# Patient Record
Sex: Female | Born: 1940 | Race: White | Hispanic: No
Health system: Midwestern US, Community
[De-identification: ages and names within clinical notes are randomized; demographics above are authoritative.]

## PROBLEM LIST (undated history)

## (undated) DIAGNOSIS — IMO0002 Reserved for concepts with insufficient information to code with codable children: Secondary | ICD-10-CM

---

## 2014-01-11 LAB — AMB POC URINALYSIS DIP STICK AUTO W/O MICRO
Bilirubin (UA POC): NEGATIVE
Blood (UA POC): NEGATIVE
Glucose (UA POC): NEGATIVE
Ketones (UA POC): NEGATIVE
Nitrites (UA POC): NEGATIVE
Protein (UA POC): NEGATIVE mg/dL
Specific gravity (UA POC): 1.02 (ref 1.001–1.035)
Urobilinogen (UA POC): 0.2 (ref 0.2–1)
pH (UA POC): 6.5 (ref 4.6–8.0)

## 2014-01-11 NOTE — Procedures (Signed)
CYSTOSCOPY PROCEDURE        Patient Name: Michelle LeepJudith F Snyder            Date of Procedure: 01/11/2014     Pre-procedure Diagnosis:     ICD-9-CM    1. 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 Urinary incontinence 788.30 AMB POC URINALYSIS DIP STICK AUTO W/O MICRO   3. Urinary frequency 788.41 AMB POC URINALYSIS DIP STICK AUTO W/O MICRO        Post-procedure Diagnosis: same    Consent:  All risks, benefits and options were reviewed in detail and the patient agrees to procedure. Risks include but are not limited to bleeding, infection, sepsis, death, dysuria and others.     Procedure:  The patient was placed in the lithotomy position, and prepped and draped in the normal fashion. 5 ml of 4% Lidocaine gel was placed in the urethra. Once adequate anesthesia was achieved; the flexible cystoscope was placed into the bladder.     Findings as follows:      Meatus: normal  Urethra: normal  Bladder neck: normal  Trigone:  normal  Trabeculation:normal  Diverticuli:  none  Lesion:  none    Antibiotic provided:  Yes     Lab / Imaging:   Results for orders placed in visit on 01/11/14   AMB POC URINALYSIS DIP STICK AUTO W/O MICRO       Result Value Ref Range    Color (UA POC) Yellow      Clarity (UA POC) Clear      Glucose (UA POC) Negative  Negative    Bilirubin (UA POC) Negative  Negative    Ketones (UA POC) Negative  Negative    Specific gravity (UA POC) 1.020  1.001 - 1.035    Blood (UA POC) Negative  Negative    pH (UA POC) 6.5  4.6 - 8.0    Protein (UA POC) Negative  Negative mg/dL    Urobilinogen (UA POC) 0.2 mg/dL  0.2 - 1    Nitrites (UA POC) Negative  Negative    Leukocyte esterase (UA POC) 1+  Negative        Diagnoses:       ICD-9-CM    1. 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 Urinary incontinence 788.30 AMB POC URINALYSIS DIP STICK AUTO W/O MICRO   3. Urinary frequency 788.41 AMB POC URINALYSIS DIP STICK AUTO W/O MICRO          SUMMARY OF VISIT AND PLAN:    1. Bladder  pain- cysto negative.     Treasa SchoolJennifer U Miles-Thomas, MD

## 2014-01-11 NOTE — Progress Notes (Signed)
ASSESSMENT:     ICD-9-CM    1. 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 Urinary incontinence 788.30 AMB POC URINALYSIS DIP STICK AUTO W/O MICRO     CYSTOURETHROSCOPY   3. Urinary frequency 788.41 AMB POC URINALYSIS DIP STICK AUTO W/O MICRO     CYSTOURETHROSCOPY                  PLAN:      1. Cystocele- resolved  2. Bladder pain- cysto negative. Possible pasage of small stones. Will begin low acid diet and order noncontrast CT to r/o stones.            DISCUSSION:      Chief Complaint   Patient presents with   ??? Bladder Pain     Patient states that she is having some right sided bladder pain that comes and goes. Patient states that she has noticed fine grains of sand in her urine. She took samples to PCP office and has not heard anything back from them.    ??? Cystocele     Patient states that she is not to bothered but it. Occassionaly she will notice some bleeding after intercourse.        HISTORY OF PRESENT ILLNESS:  Michelle Snyder is a 73 y.o. female who presents today for reevaluation. She reports that she felt she had a UTI just prior to Christmas. UA and culture were negative. She reports the passage of small amts of sand. Reports RLQ pain which increases with caffeine. Reports it is an ache. Locates it to right suprapubic pain.  Occ vaginal spotting and using vaginal estrogen. Denies SUI.        REVIEW OF SYSTEMS:     Constitutional: Fever: No  Skin: Rash: No  HEENT: Hearing difficulty: No  Eyes: Blurred vision: No  Cardiovascular: Chest pain: No  Respiratory: Shortness of breath: No  Gastrointestinal: Nausea/vomiting: No  Musculoskeletal: Back pain: No  Neurological: Weakness: No  Psychological: Memory loss: No  Comments/additional findings:       Past Medical History   Diagnosis Date   ??? Cystocele    ??? Urinary incontinence    ??? Urinary frequency    ??? UTI (urinary tract infection)    ??? High cholesterol        Past Surgical  History   Procedure Laterality Date   ??? Hx urological  01/2013     bladder sling   ??? 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 Heart Disease Mother    ??? Heart Disease Father        History   Substance Use Topics   ??? Smoking status: Former Smoker   ??? Smokeless tobacco: Never Used   ??? Alcohol Use: Yes       No Known Allergies    Current Outpatient Prescriptions   Medication Sig Dispense Refill   ??? simvastatin (ZOCOR) 20 mg tablet Take  by mouth nightly.       ??? aspirin delayed-release 81 mg tablet Take  by mouth daily.       ??? raloxifene (EVISTA) 60 mg tablet Take  by mouth daily.       ??? montelukast (SINGULAIR) 10 mg tablet Take 10 mg by mouth daily.       ???  cholecalciferol, vitamin D3, (VITAMIN D3) 2,000 unit tab Take  by mouth.       ??? multivitamin (ONE A DAY) tablet Take 1 Tab by mouth daily.       ??? glucosamine-chondroitin 750-600 mg chew Take  by mouth.       ??? naproxen sodium (ALEVE) 220 mg tablet Take 220 mg by mouth two (2) times daily (with meals).       ??? estradiol (VAGIFEM) 10 mcg tab vaginal tablet Insert 10 mcg into vagina daily.           OB History    Gravida Para Term Preterm AB TAB SAB Ectopic Multiple Living    3 2 2  0 1 0 1 0 0 0            PHYSICAL EXAMINATION:     BP 132/90    Ht 5\' 5"  (1.651 m)    Wt 148 lb (67.132 kg)    BMI 24.63 kg/m2     Constitutional: Well developed, well-nourished female in no acute distress.   CV:  No peripheral swelling noted  Respiratory: No respiratory distress or difficulties  Abdomen:  Soft and nontender. No masses.    GU Female:     Vagina: nl, no cystocele. No mesh palpated. No erosion   Urethra: nl   Cervix and uterus absent    Rectocele:  none    Urethral hypermobility is mild   Negative leak with cough/strain.    Skin: No bruising or rashes.  No petechia.    Neuro/Psych:  Patient with appropriate affect.  Alert and oriented.    Lymphatic:   No enlargement of inguinal lymph nodes.        REVIEW OF LABS AND  IMAGING:      Results for orders placed in visit on 01/11/14   AMB POC URINALYSIS DIP STICK AUTO W/O MICRO       Result Value Ref Range    Color (UA POC) Yellow      Clarity (UA POC) Clear      Glucose (UA POC) Negative  Negative    Bilirubin (UA POC) Negative  Negative    Ketones (UA POC) Negative  Negative    Specific gravity (UA POC) 1.020  1.001 - 1.035    Blood (UA POC) Negative  Negative    pH (UA POC) 6.5  4.6 - 8.0    Protein (UA POC) Negative  Negative mg/dL    Urobilinogen (UA POC) 0.2 mg/dL  0.2 - 1    Nitrites (UA POC) Negative  Negative    Leukocyte esterase (UA POC) 1+  Negative             Prior labs or imaging:  none      A copy of today's office visit with all pertinent imaging results and labs were sent to the referring physician.        Treasa School, MD on 01/15/2014           Freda Munro, M.D.,   FPM-RS  [facility]  a division of [facility] [place]    [address]  [place], [place]   [address]  [phone] ext [phone]  [phone] fax

## 2014-01-11 NOTE — Procedures (Signed)
CYSTOSCOPY PROCEDURE        Patient Name: Michelle Snyder            Date of Procedure: 01/11/2014     Pre-procedure Diagnosis:     ICD-9-CM    1. 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 Urinary incontinence 788.30 AMB POC URINALYSIS DIP STICK AUTO W/O MICRO   3. Urinary frequency 788.41 AMB POC URINALYSIS DIP STICK AUTO W/O MICRO        Post-procedure Diagnosis: same    Consent:  All risks, benefits and options were reviewed in detail and the patient agrees to procedure. Risks include but are not limited to bleeding, infection, sepsis, death, dysuria and others.     Procedure:  The patient was placed in the lithotomy position, and prepped and draped in the normal fashion. 5 ml of 4% Lidocaine gel was placed in the urethra. Once adequate anesthesia was achieved; the flexible cystoscope was placed into the bladder.     Findings as follows:      Meatus: normal  Urethra: normal  Bladder neck: normal  Trigone:  normal  Trabeculation:normal  Diverticuli:  none  Lesion:  none    Antibiotic provided:  Yes     Lab / Imaging:   Results for orders placed in visit on 01/11/14   AMB POC URINALYSIS DIP STICK AUTO W/O MICRO       Result Value Ref Range    Color (UA POC) Yellow      Clarity (UA POC) Clear      Glucose (UA POC) Negative  Negative    Bilirubin (UA POC) Negative  Negative    Ketones (UA POC) Negative  Negative    Specific gravity (UA POC) 1.020  1.001 - 1.035    Blood (UA POC) Negative  Negative    pH (UA POC) 6.5  4.6 - 8.0    Protein (UA POC) Negative  Negative mg/dL    Urobilinogen (UA POC) 0.2 mg/dL  0.2 - 1    Nitrites (UA POC) Negative  Negative    Leukocyte esterase (UA POC) 1+  Negative        Diagnoses:       ICD-9-CM    1. 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 Urinary incontinence 788.30 AMB POC URINALYSIS DIP STICK AUTO W/O MICRO   3. Urinary frequency 788.41 AMB POC URINALYSIS DIP STICK AUTO W/O MICRO          SUMMARY OF VISIT AND PLAN:    1. Bladder  pain- cysto negative.     Eunice Winecoff U Miles-Thomas, MD

## 2014-01-13 NOTE — Telephone Encounter (Signed)
Spoke with patient and advised that CT ABD is schedule on 01/21/14 at 0800 at [facility]. Patient is aware of date and time.

## 2014-01-13 NOTE — Telephone Encounter (Signed)
Patient phoned in today to inquire about noncontrast CT to r/o stones appointment.  Advised patient she was in the office just two days ago.  Information will be forward to JMT's nurse to schedule.

## 2014-01-13 NOTE — Telephone Encounter (Signed)
Left message for patient to return call regarding scheduling CT ABD

## 2014-01-25 ENCOUNTER — Telehealth

## 2014-01-25 NOTE — Telephone Encounter (Signed)
Spoke with patient and advised that CT Urogram is normal. No stones or obstruction scene. Patient stated that sometimes she still has a dull ache of her left side that comes and goes. I advised patient that Dr. Lanier EnsignJMT would be out of the office for the next week but that I would forward this information to her and contact her back when she returns back to the office. Patient verbally understood.

## 2014-02-09 NOTE — Telephone Encounter (Signed)
Attempted to contact patient to Let her know the CT was fine. No stones or obstruction however patient did not have voicemail set up and phone just rang busy.

## 2014-02-10 NOTE — Telephone Encounter (Signed)
Left message for patient to return call regarding CT.

## 2014-02-10 NOTE — Telephone Encounter (Signed)
Patient called back.  I let her know Terri had tried to call this am to let her know CT was fine.

## 2014-12-13 NOTE — Telephone Encounter (Signed)
Returned call to patient no answer, left message for patient to return the call. Patient last seen 01/2014. Patient will need an office visit appointment.

## 2014-12-13 NOTE — Telephone Encounter (Signed)
-----   Message from Jayme Cloudatherine M Vaughan sent at 12/13/2014  1:08 PM EST -----  Patient thinks she may have uti.  Asking for appt or Rx.  Please call her at [phone]    [facility] Hospital[facility]athy

## 2014-12-20 ENCOUNTER — Encounter: Attending: Urology

## 2014-12-21 ENCOUNTER — Ambulatory Visit: Admit: 2014-12-21 | Payer: MEDICARE | Attending: Urology

## 2014-12-21 DIAGNOSIS — N3000 Acute cystitis without hematuria: Secondary | ICD-10-CM

## 2014-12-21 LAB — AMB POC URINALYSIS DIP STICK AUTO W/O MICRO
Bilirubin (UA POC): NEGATIVE
Glucose (UA POC): NEGATIVE
Nitrites (UA POC): POSITIVE
Specific gravity (UA POC): 1.02 (ref 1.001–1.035)
Urobilinogen (UA POC): 0.2 (ref 0.2–1)
pH (UA POC): 7.5 (ref 4.6–8.0)

## 2014-12-21 MED ORDER — PHENAZOPYRIDINE 200 MG TAB
200 mg | ORAL_TABLET | Freq: Three times a day (TID) | ORAL | Status: AC | PRN
Start: 2014-12-21 — End: 2014-12-24

## 2014-12-21 MED ORDER — CIPROFLOXACIN 500 MG TAB
500 mg | ORAL_TABLET | Freq: Two times a day (BID) | ORAL | Status: AC
Start: 2014-12-21 — End: 2014-12-24

## 2014-12-21 NOTE — Progress Notes (Signed)
ASSESSMENT:     ICD-10-CM ICD-9-CM    1. Acute cystitis without hematuria N30.00 595.0 phenazopyridine (PYRIDIUM) 200 mg tablet      ciprofloxacin HCl (CIPRO) 500 mg tablet      AMB POC URINALYSIS DIP STICK AUTO W/O MICRO      URINE C&S   2. Cystocele N81.10 618.01 phenazopyridine (PYRIDIUM) 200 mg tablet      ciprofloxacin HCl (CIPRO) 500 mg tablet      AMB POC URINALYSIS DIP STICK AUTO W/O MICRO      URINE C&S   3. Urinary incontinence, unspecified incontinence type R32 788.30 phenazopyridine (PYRIDIUM) 200 mg tablet      ciprofloxacin HCl (CIPRO) 500 mg tablet      AMB POC URINALYSIS DIP STICK AUTO W/O MICRO      URINE C&S              PLAN:      1. UTI   - will begin rx with cipro.  Urine will be sent for culture and she will be notified with the result.   2. Pyridium 200 mg  TID x 2d per use if she needs it.      She will f/u in 3-4 mos in [place] or sooner if needed.          DISCUSSION:      Chief Complaint   Patient presents with   ??? UTI     pt was having Dysuria , cloudy urine. pt currently does have sxs.     Pt presents today for possible UTI. SHe reports intermittent dysuria. Sxs began over the weekend. Denies hematuria or fevers. She also reports her urine is cloudy. Here today for evaluation of possible UTI.       Last OV 01/2014  Michelle Snyder is a 74 y.o. female who presents today with complaint of dysuria, frequency, urgency cloudy urine, and pelvic pressure that resolved 1 week ago; these have resolved now. In October, Dr. Ventura Sellers performed UA, provided patient Macrobid 100 mg to use post-coitally. She had it on hand while on a cruise in December; used Macrobid 12/01/14 - 12/08/13. She continued with symptoms until 01/16/14. She uses macrobid postcoitally.     01/21/14  Prior history obtained on 01/21/14: for reevaluation. She reports that she felt she had a UTI just prior to Christmas. UA and culture were negative.  She reports the passage of small amts of sand. Reports RLQ pain which increases with caffeine. Reports it is an ache. Locates it to right suprapubic pain.  Occ vaginal spotting and using vaginal estrogen. Denies SUI.        REVIEW OF SYSTEMS:     Constitutional: Fever: No  Skin: Rash: No  HEENT: Hearing difficulty: No  Eyes: Blurred vision: No  Cardiovascular: Chest pain: No  Respiratory: Shortness of breath: No  Gastrointestinal: Nausea/vomiting: No  Musculoskeletal: Back pain: No  Neurological: Weakness: No  Psychological: Memory loss: No  Comments/additional findings:       Past Medical History   Diagnosis Date   ??? Cystocele    ??? Urinary incontinence    ??? Urinary frequency    ??? UTI (urinary tract infection)    ??? High cholesterol        Past Surgical History   Procedure Laterality Date   ??? Hx urological  01/2013     bladder sling   ??? 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 Heart Disease Mother    ??? Heart Disease Father        History   Substance Use Topics   ??? Smoking status: Former Smoker   ??? Smokeless tobacco: Never Used   ??? Alcohol Use: Yes       Allergies   Allergen Reactions   ??? Sulfa (Sulfonamide Antibiotics) Other (comments)     50 yrs ago       Current Outpatient Prescriptions   Medication Sig Dispense Refill   ??? phenazopyridine (PYRIDIUM) 200 mg tablet Take 1 Tab by mouth three (3) times daily as needed for Pain for up to 3 days. 20 Tab 0   ??? ciprofloxacin HCl (CIPRO) 500 mg tablet Take 1 Tab by mouth two (2) times a day for 3 days. 6 Tab 0   ??? simvastatin (ZOCOR) 20 mg tablet Take  by mouth nightly.     ??? aspirin delayed-release 81 mg tablet Take  by mouth daily.     ??? raloxifene (EVISTA) 60 mg tablet Take  by mouth daily.     ??? montelukast (SINGULAIR) 10 mg tablet Take 10 mg by mouth daily.     ??? cholecalciferol, vitamin D3, (VITAMIN D3) 2,000 unit tab Take  by mouth.     ??? multivitamin (ONE A DAY) tablet Take 1 Tab by mouth daily.      ??? glucosamine-chondroitin 750-600 mg chew Take  by mouth.     ??? naproxen sodium (ALEVE) 220 mg tablet Take 220 mg by mouth two (2) times daily (with meals).     ??? estradiol (VAGIFEM) 10 mcg tab vaginal tablet Insert 10 mcg into vagina daily.         OB History     Gravida Para Term Preterm AB TAB SAB Ectopic Multiple Living    0 1 0 1 0 0 0            PHYSICAL EXAMINATION:     BP 140/80 mmHg   Ht  (1.651 m)   Wt 148 lb (67.132 kg)   BMI 24.63 kg/m2  Constitutional: Well developed, well-nourished female in no acute distress.   CV:  No peripheral swelling noted  Respiratory: No respiratory distress or difficulties  Abdomen:  Soft and nontender. No masses.    GU Female:  deferred  Skin: No bruising or rashes.  No petechia.    Neuro/Psych:  Patient with appropriate affect.  Alert and oriented.    Lymphatic:   No enlargement of inguinal lymph nodes.        REVIEW OF LABS AND IMAGING:      Results for orders placed or performed in visit on 12/21/14   AMB POC URINALYSIS DIP STICK AUTO W/O MICRO   Result Value Ref Range    Color (UA POC) Yellow     Clarity (UA POC) Clear     Glucose (UA POC) Negative Negative    Bilirubin (UA POC) Negative Negative    Ketones (UA POC) Trace Negative    Specific gravity (UA POC) 1.020 1.001 - 1.035    Blood (UA POC) Trace Negative    pH (UA POC) 7.5 4.6 - 8.0    Protein (UA POC) 1+ Negative mg/dL    Urobilinogen (UA POC) 0.2 mg/dL 0.2 - 1    Nitrites (UA POC) Positive Negative    Leukocyte esterase (UA POC) 4+ Negative  Prior labs or imaging:  none      A copy of today's office visit with all pertinent imaging results and labs were sent to the referring physician.        Pollyann SavoyElizabeth W Purrington, NP on 12/21/2014           Freda MunroJennifer Miles-Thomas, M.D.,   FPM-RS  [facility]  a division [facility] [place]    [address]  [place], [place]   [id number]  [phone] ext [phone]  [phone] fax

## 2014-12-23 LAB — URINE C&S

## 2014-12-27 MED ORDER — AMOXICILLIN-CLAVULANATE 500 MG-125 MG TAB
500-125 mg | ORAL_TABLET | Freq: Two times a day (BID) | ORAL | Status: DC
Start: 2014-12-27 — End: 2015-02-04

## 2014-12-27 NOTE — Progress Notes (Signed)
Quick Note:        Let her know the urine culture was positive and the cipro may not treat the infection. Sent augmentin into the pharmacy and I LM on pts cell VM.        JMT    ______

## 2014-12-28 NOTE — Telephone Encounter (Signed)
Patient called and lab results were given.  Patient did receive message about RX sent to her pharmacy.

## 2014-12-28 NOTE — Progress Notes (Signed)
Quick Note:        Patient called and lab results were given.        ______

## 2014-12-28 NOTE — Telephone Encounter (Signed)
-----   Message from Treasa SchoolJennifer U Miles-Thomas, MD sent at 12/27/2014  4:55 PM EST -----  Let her know the urine culture was positive and the cipro may not treat the infection. Sent augmentin into the pharmacy and I LM on pts cell VM.    JMT

## 2015-02-04 ENCOUNTER — Ambulatory Visit: Admit: 2015-02-04 | Discharge: 2015-02-04 | Payer: MEDICARE | Attending: Urology

## 2015-02-04 DIAGNOSIS — IMO0002 Reserved for concepts with insufficient information to code with codable children: Secondary | ICD-10-CM

## 2015-02-04 LAB — AMB POC URINALYSIS DIP STICK AUTO W/O MICRO
Bilirubin (UA POC): NEGATIVE
Blood (UA POC): NEGATIVE
Glucose (UA POC): NEGATIVE
Ketones (UA POC): NEGATIVE
Leukocyte esterase (UA POC): NEGATIVE
Nitrites (UA POC): NEGATIVE
Protein (UA POC): NEGATIVE mg/dL
Specific gravity (UA POC): 1.303 — AB (ref 1.001–1.035)
Urobilinogen (UA POC): 0.2 (ref 0.2–1)
pH (UA POC): 5.5 (ref 4.6–8.0)

## 2015-02-04 LAB — AMB POC PVR, MEAS,POST-VOID RES,US,NON-IMAGING: PVR: 0 cc

## 2015-02-04 MED ORDER — AMOXICILLIN-CLAVULANATE 500 MG-125 MG TAB
500-125 mg | ORAL_TABLET | Freq: Two times a day (BID) | ORAL | Status: DC
Start: 2015-02-04 — End: 2015-12-30

## 2015-02-04 MED ORDER — AMOXICILLIN-CLAVULANATE 500 MG-125 MG TAB
500-125 mg | ORAL_TABLET | Freq: Two times a day (BID) | ORAL | Status: DC
Start: 2015-02-04 — End: 2015-02-04

## 2015-02-04 NOTE — Progress Notes (Signed)
ASSESSMENT:     ICD-10-CM ICD-9-CM    1. Cystocele, unspecified cystocele location N81.10 618.01 AMB POC URINALYSIS DIP STICK AUTO W/O MICRO      AMB POC PVR, MEAS,POST-VOID RES,US,NON-IMAGING   2. Urinary incontinence, unspecified type R32 788.30 AMB POC URINALYSIS DIP STICK AUTO W/O MICRO      AMB POC PVR, MEAS,POST-VOID RES,US,NON-IMAGING   3. Urinary frequency R35.0 788.41 AMB POC URINALYSIS DIP STICK AUTO W/O MICRO      AMB POC PVR, MEAS,POST-VOID RES,US,NON-IMAGING   4. Recurrent UTI N39.0 599.0 AMB POC URINALYSIS DIP STICK AUTO W/O MICRO      AMB POC PVR, MEAS,POST-VOID RES,US,NON-IMAGING      amoxicillin-clavulanate (AUGMENTIN) 500-125 mg per tablet              PLAN:      1. UTI   - will begin rx with cipro.  Urine will be sent for culture and she will be notified with the result.   2. Rx for Augmentin 500/125 given for travel.    She will f/u in 2mos  or sooner if needed.          DISCUSSION:      Chief Complaint   Patient presents with   ??? Recurrent UTI     Pt presents today for possible UTI. She denied dysuria however experienced some burning in abdomen right after intercourse. Burning sensation subsided after about an hour. She noticed she that anything acidic or inflammatory would aggravate the area. She is going on a cruise and would like a prophylaxis .     Last visit 12/28/2014:   SHe reports intermittent dysuria.Sxs began over the weekend. Denies hematuria or fevers. She also reports her urine is cloudy. Here today for evaluation of possible UTI.       Last OV 01/2014  Michelle Snyder is a 74 y.o. female who presents today with complaint of dysuria, frequency, urgency cloudy urine, and pelvic pressure that resolved 1 week ago; these have resolved now. In October, Dr. [facility] performed UA, provided patient Macrobid 100 mg to use post-coitally. She had it on hand while on a cruise in December; used Macrobid 12/01/14 - 12/08/13. She continued with symptoms until 01/16/14. She  uses macrobid postcoitally.     01/21/14  Prior history obtained on 01/21/14: for reevaluation. She reports that she felt she had a UTI just prior to Christmas. UA and culture were negative. She reports the passage of small amts of sand. Reports RLQ pain which increases with caffeine. Reports it is an ache. Locates it to right suprapubic pain.  Occ vaginal spotting and using vaginal estrogen. Denies SUI.        REVIEW OF SYSTEMS:     Constitutional: Fever: No  Skin: Rash: No  HEENT: Hearing difficulty: No  Eyes: Blurred vision: No  Cardiovascular: Chest pain: No  Respiratory: Shortness of breath: No  Gastrointestinal: Nausea/vomiting: No  Musculoskeletal: Back pain: No  Neurological: Weakness:   Psychological: Memory loss: No  Comments/additional findings:       Past Medical History   Diagnosis Date   ??? Cystocele    ??? Urinary incontinence    ??? Urinary frequency    ??? UTI (urinary tract infection)    ??? High cholesterol        Past Surgical History   Procedure Laterality Date   ??? Hx urological  01/2013     bladder sling   ??? Hx knee replacement  2/20110  partial        Family History   Problem Relation Age of Onset   ??? Heart Disease Mother    ??? Heart Disease Father        History   Substance Use Topics   ??? Smoking status: Former Smoker   ??? Smokeless tobacco: Never Used   ??? Alcohol Use: Yes       Allergies   Allergen Reactions   ??? Alendronate Other (comments)   ??? Sulfa (Sulfonamide Antibiotics) Other (comments)     50 yrs ago       Current Outpatient Prescriptions   Medication Sig Dispense Refill   ??? amoxicillin-clavulanate (AUGMENTIN) 500-125 mg per tablet Take 1 Tab by mouth two (2) times a day. 10 Tab 0   ??? simvastatin (ZOCOR) 20 mg tablet Take  by mouth nightly.     ??? aspirin delayed-release 81 mg tablet Take  by mouth daily.     ??? raloxifene (EVISTA) 60 mg tablet Take  by mouth daily.     ??? montelukast (SINGULAIR) 10 mg tablet Take 10 mg by mouth daily.      ??? cholecalciferol, vitamin D3, (VITAMIN D3) 2,000 unit tab Take  by mouth.     ??? multivitamin (ONE A DAY) tablet Take 1 Tab by mouth daily.     ??? glucosamine-chondroitin 750-600 mg chew Take  by mouth.     ??? naproxen sodium (ALEVE) 220 mg tablet Take 220 mg by mouth two (2) times daily (with meals).     ??? estradiol (VAGIFEM) 10 mcg tab vaginal tablet Insert 10 mcg into vagina daily.         OB History     Gravida Para Term Preterm AB TAB SAB Ectopic Multiple Living    0 1 0 1 0 0 0            PHYSICAL EXAMINATION:     Ht  (1.651 m)   Wt 148 lb (67.132 kg)   BMI 24.63 kg/m2  Constitutional: Well developed, well-nourished female in no acute distress.   CV:  No peripheral swelling noted  Respiratory: No respiratory distress or difficulties  Abdomen:  Soft and nontender. No masses.    GU Female:  deferred  Skin: No bruising or rashes.  No petechia.    Neuro/Psych:  Patient with appropriate affect.  Alert and oriented.    Lymphatic:   No enlargement of inguinal lymph nodes.        REVIEW OF LABS AND IMAGING:      Results for orders placed or performed in visit on 12/21/14   URINE C&S   Result Value Ref Range    FINAL REPORT Microbiology results (A)    AMB POC URINALYSIS DIP STICK AUTO W/O MICRO   Result Value Ref Range    Color (UA POC) Yellow     Clarity (UA POC) Clear     Glucose (UA POC) Negative Negative    Bilirubin (UA POC) Negative Negative    Ketones (UA POC) Trace Negative    Specific gravity (UA POC) 1.020 1.001 - 1.035    Blood (UA POC) Trace Negative    pH (UA POC) 7.5 4.6 - 8.0    Protein (UA POC) 1+ Negative mg/dL    Urobilinogen (UA POC) 0.2 mg/dL 0.2 - 1    Nitrites (UA POC) Positive Negative    Leukocyte esterase (UA POC) 4+ Negative  Prior labs or imaging:  none      A copy of today's office visit with all pertinent imaging results and labs were sent to the referring physician.        Treasa School, MD on 02/04/2015           Freda Munro, M.D.,   FPM-RS   [facility]  a division of [facility] [place]    [address]  [place], [place]   [address]  [phone] ext [phone]  [phone] fax

## 2015-04-22 ENCOUNTER — Ambulatory Visit: Admit: 2015-04-22 | Discharge: 2015-04-22 | Payer: MEDICARE | Attending: Urology

## 2015-04-22 DIAGNOSIS — IMO0002 Reserved for concepts with insufficient information to code with codable children: Secondary | ICD-10-CM

## 2015-04-22 LAB — AMB POC URINALYSIS DIP STICK AUTO W/O MICRO
Bilirubin (UA POC): NEGATIVE
Blood (UA POC): NEGATIVE
Glucose (UA POC): NEGATIVE
Ketones (UA POC): NEGATIVE
Nitrites (UA POC): NEGATIVE
Protein (UA POC): NEGATIVE mg/dL
Specific gravity (UA POC): 1.025 (ref 1.001–1.035)
Urobilinogen (UA POC): 0.2 (ref 0.2–1)
pH (UA POC): 6.5 (ref 4.6–8.0)

## 2015-04-22 LAB — AMB POC PVR, MEAS,POST-VOID RES,US,NON-IMAGING: PVR: 8 cc

## 2015-04-22 MED ORDER — NITROFURANTOIN (25% MACROCRYSTAL FORM) 100 MG CAP
100 mg | ORAL_CAPSULE | Freq: Every evening | ORAL | Status: DC | PRN
Start: 2015-04-22 — End: 2015-12-30

## 2015-04-22 NOTE — Progress Notes (Signed)
ASSESSMENT:     ICD-10-CM ICD-9-CM    1. Cystocele, unspecified cystocele location N81.10 618.01 AMB POC PVR, MEAS,POST-VOID RES,US,NON-IMAGING      AMB POC URINALYSIS DIP STICK AUTO W/O MICRO        PLAN:      1. UTI   - UA today: normal. no current symptoms of an infection. Given pericoital antibiotic: Macrobid 100 mg- QHS PRN     F/u in 6 months       DISCUSSION:      Chief Complaint   Patient presents with   ??? Recurrent UTI     Patient is in for a follow up today. Patient denies any sxs of UTI.       History of Present Illness:  Michelle Snyder is a 74 y.o. female who presents today for follow up and management of recurrent infections.   Patient states that she recently finished Cipro and has not experiencing and symptoms associated with an infection. No current symptoms. Denies any dysuria, hematuria, or voiding symptoms.   No f/n/c/v.       Last OV 01/2014  Michelle Snyder is a 74 y.o. female who presents today with complaint of dysuria, frequency, urgency cloudy urine, and pelvic pressure that resolved 1 week ago; these have resolved now. In October, Dr. [facility] performed UA, provided patient Macrobid 100 mg to use post-coitally. She had it on hand while on a cruise in December; used Macrobid 12/01/14 - 12/08/13. She continued with symptoms until 01/16/14. She uses macrobid postcoitally.     01/21/14  Prior history obtained on 01/21/14: for reevaluation. She reports that she felt she had a UTI just prior to Christmas. UA and culture were negative. She reports the passage of small amts of sand. Reports RLQ pain which increases with caffeine. Reports it is an ache. Locates it to right suprapubic pain.  Occ vaginal spotting and using vaginal estrogen. Denies SUI.        REVIEW OF SYSTEMS:     Constitutional: Fever: No  Skin: Rash: No  HEENT: Hearing difficulty: No  Eyes: Blurred vision: No  Cardiovascular: Chest pain: No  Respiratory: Shortness of breath: No   Gastrointestinal: Nausea/vomiting: No  Musculoskeletal: Back pain: No  Neurological: Weakness: No  Psychological: Memory loss: No  Comments/additional findings:       Past Medical History   Diagnosis Date   ??? Cystocele    ??? Urinary incontinence    ??? Urinary frequency    ??? UTI (urinary tract infection)    ??? High cholesterol    ??? Recurrent UTI        Past Surgical History   Procedure Laterality Date   ??? Hx urological  01/2013     bladder sling   ??? 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 Heart Disease Mother    ??? Heart Disease Father        History   Substance Use Topics   ??? Smoking status: Former Smoker   ??? Smokeless tobacco: Never Used   ??? Alcohol Use: Yes       Allergies   Allergen Reactions   ??? Alendronate Other (comments)   ??? Sulfa (Sulfonamide Antibiotics) Other (comments)     50 yrs ago       Current Outpatient Prescriptions   Medication Sig Dispense Refill   ??? levothyroxine (  SYNTHROID) 75 mcg tablet      ??? simvastatin (ZOCOR) 20 mg tablet Take  by mouth nightly.     ??? aspirin delayed-release 81 mg tablet Take  by mouth daily.     ??? raloxifene (EVISTA) 60 mg tablet Take  by mouth daily.     ??? cholecalciferol, vitamin D3, (VITAMIN D3) 2,000 unit tab Take  by mouth.     ??? multivitamin (ONE A DAY) tablet Take 1 Tab by mouth daily.     ??? glucosamine-chondroitin 750-600 mg chew Take  by mouth.     ??? naproxen sodium (ALEVE) 220 mg tablet Take 220 mg by mouth two (2) times daily (with meals).     ??? estradiol (VAGIFEM) 10 mcg tab vaginal tablet Insert 10 mcg into vagina daily.     ??? amoxicillin-clavulanate (AUGMENTIN) 500-125 mg per tablet Take 1 Tab by mouth two (2) times a day. 10 Tab 0       OB History     Gravida Para Term Preterm AB TAB SAB Ectopic Multiple Living    0 1 0 1 0 0 0            PHYSICAL EXAMINATION:     BP 110/60 mmHg   Ht  (1.651 m)   Wt 148 lb (67.132 kg)   BMI 24.63 kg/m2   Constitutional: Well developed, well-nourished female in no acute distress.   CV:  No peripheral swelling noted  Respiratory: No respiratory distress or difficulties  Abdomen:  Soft and nontender. No masses.    GU Female:  deferred  Skin: No bruising or rashes.  No petechia.    Neuro/Psych:  Patient with appropriate affect.  Alert and oriented.    Lymphatic:   No enlargement of inguinal lymph nodes.        REVIEW OF LABS AND IMAGING:      Results for orders placed or performed in visit on 04/22/15   AMB POC PVR, MEAS,POST-VOID RES,US,NON-IMAGING   Result Value Ref Range    PVR 8 cc   AMB POC URINALYSIS DIP STICK AUTO W/O MICRO   Result Value Ref Range    Color (UA POC) Yellow     Clarity (UA POC) Clear     Glucose (UA POC) Negative Negative    Bilirubin (UA POC) Negative Negative    Ketones (UA POC) Negative Negative    Specific gravity (UA POC) 1.025 1.001 - 1.035    Blood (UA POC) Negative Negative    pH (UA POC) 6.5 4.6 - 8.0    Protein (UA POC) Negative Negative mg/dL    Urobilinogen (UA POC) 0.2 mg/dL 0.2 - 1    Nitrites (UA POC) Negative Negative    Leukocyte esterase (UA POC) Trace Negative             Prior labs or imaging:  none      A copy of today's office visit with all pertinent imaging results and labs were sent to the referring physician.        Treasa School, MD on 04/22/2015           Freda Munro, M.D.,   FPM-RS  [facility]  a division of [facility] [place]    [address]  [place], [place]   [address]  [phone] ext [phone]  [phone] fax        Medical documentation provided with the assistance of Britt Bottom,  medical scribe for Treasa School, MD.

## 2015-10-21 ENCOUNTER — Encounter: Attending: Urology

## 2015-12-23 LAB — VITAMIN D, 25 HYDROXY: Vit D 25-Hydroxy: 48.5 ng/mL (ref 32.0–100.0)

## 2015-12-30 ENCOUNTER — Ambulatory Visit: Admit: 2015-12-30 | Discharge: 2015-12-30 | Payer: MEDICARE | Attending: Urology

## 2015-12-30 ENCOUNTER — Encounter: Attending: Urology

## 2015-12-30 DIAGNOSIS — N390 Urinary tract infection, site not specified: Secondary | ICD-10-CM

## 2015-12-30 LAB — AMB POC URINALYSIS DIP STICK AUTO W/O MICRO
Bilirubin (UA POC): NEGATIVE
Glucose (UA POC): NEGATIVE
Ketones (UA POC): NEGATIVE
Nitrites (UA POC): NEGATIVE
Protein (UA POC): NEGATIVE mg/dL
Specific gravity (UA POC): 1.01 (ref 1.001–1.035)
Urobilinogen (UA POC): 0.2 (ref 0.2–1)
pH (UA POC): 7 (ref 4.6–8.0)

## 2015-12-30 MED ORDER — NITROFURANTOIN (25% MACROCRYSTAL FORM) 100 MG CAP
100 mg | ORAL_CAPSULE | Freq: Every evening | ORAL | 3 refills | Status: DC | PRN
Start: 2015-12-30 — End: 2018-04-25

## 2015-12-30 NOTE — Progress Notes (Signed)
ASSESSMENT:     ICD-10-CM ICD-9-CM    1. Recurrent UTI N39.0 599.0 AMB POC URINALYSIS DIP STICK AUTO W/O MICRO      nitrofurantoin, macrocrystal-monohydrate, (MACROBID) 100 mg capsule        PLAN:      1. Recurrent UTI   - UA clear today. No recent infections. Continue Macrobid 100 mg post coitally. Refill given today.     Follow up in 1 year       DISCUSSION:      Chief Complaint   Patient presents with   ??? Recurrent UTI     Pt presents for  9 month f/u. Pt states she has not had any infections thanks to the MACROBID 100 MG. Denies any and all urinary infection symptoms at this time.        History of Present Illness:  Michelle Snyder is a 75 y.o. female who presents today in follow up for recurrent UTI.   She denies any infection since we last met in May 2016. She continues on Macrobid 100 mg post coitus.  She had a negative hematuria work up in 2015.  No issues at this time. She denies any urinary frequency, urgency, incomplete bladder emptying.  She is asymptomatic for UTI today. No dysuria, gross hematuria. No f/c/n/v.       PRIOR VISIT:  5//20/16: She presents today for follow up and management of recurrent infections.   Patient states that she recently finished Cipro and has not experiencing and symptoms associated with an infection. No current symptoms. Denies any dysuria, hematuria, or voiding symptoms.   No f/n/c/v.     01/2014 who presents today with complaint of dysuria, frequency, urgency cloudy urine, and pelvic pressure that resolved 1 week ago; these have resolved now. In October, Dr. Georgann Housekeeper performed UA, provided patient Macrobid 100 mg to use post-coitally. She had it on hand while on a cruise in December; used Macrobid 12/01/14 - 12/08/13. She continued with symptoms until 01/16/14. She uses macrobid postcoitally.     01/21/14 Prior history obtained on 01/21/14: for reevaluation. She reports that she felt she had a UTI just prior to Christmas. UA and culture were  negative. She reports the passage of small amts of sand. Reports RLQ pain which increases with caffeine. Reports it is an ache. Locates it to right suprapubic pain.  Occ vaginal spotting and using vaginal estrogen. Denies SUI.        REVIEW OF SYSTEMS:     Constitutional: Fever: No  Skin: Rash: No  HEENT: Hearing difficulty: No  Eyes: Blurred vision: No  Cardiovascular: Chest pain: No  Respiratory: Shortness of breath: No  Gastrointestinal: Nausea/vomiting: No  Musculoskeletal: Back pain: No  Neurological: Weakness: No  Psychological: Memory loss: No  Comments/additional findings:       Past Medical History   Diagnosis Date   ??? Cystocele    ??? Cystocele, unspecified    ??? High cholesterol    ??? Recurrent UTI    ??? Urinary frequency    ??? Urinary incontinence    ??? UTI (urinary tract infection)        Past Surgical History   Procedure Laterality Date   ??? Hx urological  01/2013     bladder sling   ??? Hx knee replacement  2/20110     partial    ??? Hx other surgical  2005     PLASTIC SURGERY   ??? Hx other surgical  1971  FINE NEEDLE ASPIRATE, CYTOLOGY   ??? Hx polypectomy  2007   ??? Hx other surgical  12/20/2008     CYSTOCELE REPAIR SLING POST PROCEDURE ORDERSET 72       Family History   Problem Relation Age of Onset   ??? Heart Disease Mother    ??? Heart Disease Father        Social History   Substance Use Topics   ??? Smoking status: Former Smoker   ??? Smokeless tobacco: Never Used   ??? Alcohol use Yes       Allergies   Allergen Reactions   ??? Alendronate Other (comments)   ??? Sulfa (Sulfonamide Antibiotics) Other (comments)     50 yrs ago       Current Outpatient Prescriptions   Medication Sig Dispense Refill   ??? GLUCOSAMINE HCL (GLUCOSAMINE, BULK,) powd 1 Tab by Misc.(Non-Drug; Combo Route) route Once a Day.      ??? cetirizine (ZYRTEC) 10 mg tablet 10 mg.     ??? levothyroxine (SYNTHROID) 175 mcg tablet 0.075 mg.     ??? atorvastatin (LIPITOR) 10 mg tablet      ??? nitrofurantoin, macrocrystal-monohydrate, (MACROBID) 100 mg capsule Take  1 Cap by mouth nightly as needed. 30 Cap 3   ??? simvastatin (ZOCOR) 20 mg tablet Take  by mouth nightly.     ??? aspirin delayed-release 81 mg tablet Take  by mouth daily.     ??? raloxifene (EVISTA) 60 mg tablet Take  by mouth daily.     ??? cholecalciferol, vitamin D3, (VITAMIN D3) 2,000 unit tab Take  by mouth.     ??? multivitamin (ONE A DAY) tablet Take 1 Tab by mouth daily.     ??? glucosamine-chondroitin 750-600 mg chew Take  by mouth.     ??? naproxen sodium (ALEVE) 220 mg tablet Take 220 mg by mouth two (2) times daily (with meals).     ??? estradiol (VAGIFEM) 10 mcg tab vaginal tablet Insert 10 mcg into vagina daily.         OB History     Gravida Para Term Preterm AB TAB SAB Ectopic Multiple Living    '3 2 2 ' 0 1 0 1 0 0 0            PHYSICAL EXAMINATION:     Visit Vitals   ??? BP 106/82   ??? Ht '5\' 5"'  (1.651 m)   ??? Wt 148 lb (67.1 kg)   ??? BMI 24.63 kg/m2     Constitutional: Well developed, well-nourished female in no acute distress.   CV:  No peripheral swelling noted  Respiratory: No respiratory distress or difficulties  Abdomen:  Soft and nontender. No masses.    GU Female:  deferred  Skin: No bruising or rashes.  No petechia.    Neuro/Psych:  Patient with appropriate affect.  Alert and oriented.    Lymphatic:   No enlargement of inguinal lymph nodes.        REVIEW OF LABS AND IMAGING:      Results for orders placed or performed in visit on 12/30/15   AMB POC URINALYSIS DIP STICK AUTO W/O MICRO   Result Value Ref Range    Color (UA POC) Yellow     Clarity (UA POC) Clear     Glucose (UA POC) Negative Negative    Bilirubin (UA POC) Negative Negative    Ketones (UA POC) Negative Negative    Specific gravity (UA POC) 1.010 1.001 - 1.035  Blood (UA POC) 2+ Negative    pH (UA POC) 7.0 4.6 - 8.0    Protein (UA POC) Negative Negative mg/dL    Urobilinogen (UA POC) 0.2 mg/dL 0.2 - 1    Nitrites (UA POC) Negative Negative    Leukocyte esterase (UA POC) 2+ Negative             Prior labs or imaging:  none       A copy of today's office visit with all pertinent imaging results and labs were sent to the referring physician.        Brantley Fling, MD on 01/16/2016           Michaell Cowing, M.D.,   [facility]  a division of [facility] [place]    [place], [place]  [phone] ext [phone]  [phone] fax      Documentation assisted by Heron Nay, medical scribe for Brantley Fling, MD.

## 2016-02-28 NOTE — Telephone Encounter (Signed)
Patient called to ask Dr Wyvonnia DuskyMiles-Thomas is she would send in a prescription for her to take on vacation in case she was to get a uti while gone.  She said we have done this in the past for her.  She currently takes macrobid after intercourse.  She is leaving April 21 and return 5/11.  I told her I would ask and we will call her back.

## 2016-02-29 MED ORDER — AMOXICILLIN CLAVULANATE 875 MG-125 MG TAB
875-125 mg | ORAL_TABLET | Freq: Two times a day (BID) | ORAL | 0 refills | Status: DC
Start: 2016-02-29 — End: 2017-09-03

## 2016-02-29 NOTE — Telephone Encounter (Signed)
Called Michelle Snyder back to let her know [facility] would not prescribe Cipro for the patient to take on vacation, because the last culture was resistant to Cipro  But senssitive to Augmentin.  The patient still wanted to argue that the Cipro didn't clear up an infection last year.  I again explained why the doctor was prescribing Augmentin instead of Cipro.  The patient's reply was "oh well".

## 2016-02-29 NOTE — Telephone Encounter (Signed)
Per Dr Wyvonnia DuskyMiles-Thomas ok to send in Augmentin 875 mg bid x 5 days for trip

## 2016-12-28 ENCOUNTER — Encounter: Attending: Urology

## 2017-01-07 ENCOUNTER — Ambulatory Visit: Admit: 2017-01-07 | Discharge: 2017-01-07 | Payer: MEDICARE | Attending: Urology

## 2017-01-07 DIAGNOSIS — R32 Unspecified urinary incontinence: Secondary | ICD-10-CM

## 2017-01-07 LAB — AMB POC URINALYSIS DIP STICK AUTO W/O MICRO
Bilirubin (UA POC): NEGATIVE
Glucose (UA POC): NEGATIVE
Ketones (UA POC): NEGATIVE
Leukocyte esterase (UA POC): NEGATIVE
Nitrites (UA POC): NEGATIVE
Protein (UA POC): NEGATIVE
Specific gravity (UA POC): 1.015 (ref 1.001–1.035)
Urobilinogen (UA POC): 0.2 (ref 0.2–1)
pH (UA POC): 7 (ref 4.6–8.0)

## 2017-01-07 MED ORDER — AMOXICILLIN CLAVULANATE 875 MG-125 MG TAB
875-125 mg | ORAL_TABLET | ORAL | 2 refills | Status: DC
Start: 2017-01-07 — End: 2017-09-03

## 2017-01-07 MED ORDER — NITROFURANTOIN (25% MACROCRYSTAL FORM) 100 MG CAP
100 mg | ORAL_CAPSULE | Freq: Every evening | ORAL | 3 refills | Status: DC
Start: 2017-01-07 — End: 2017-09-09

## 2017-01-07 NOTE — Progress Notes (Signed)
ASSESSMENT:     ICD-10-CM ICD-9-CM    1. Urinary incontinence, unspecified type R32 788.30 AMB POC URINALYSIS DIP STICK AUTO W/O MICRO   2. Urinary frequency R35.0 788.41 AMB POC URINALYSIS DIP STICK AUTO W/O MICRO   3. Recurrent UTI N39.0 599.0       PLAN:      1. Recurrent UTI  - UA today trace blood. Stable, no recent infections and asymptomatic. Patient is aware the she can schedule a same day UTI appointment within the RECON pod if she feels the onset of infectious symptoms. Aware of cath specimen.    - Discussed use of Augmentin PO BID x5 days in the presence of UTI associated symptoms or when travelling - rx provided.    - Continue post coital Macrobid - rx provided.     2. Body mass index is 24.63 kg/(m^2). BMI is within the normal parameters.     RTC 1 year; sooner if needed.   All questions answered     DISCUSSION:      Chief Complaint   Patient presents with   ??? Recurrent UTI      63yrf/u pt has no sxs at this time.        History of Present Illness:  Michelle STIEFELis a 76y.o. female who presents today in follow up for recurrent UTI. Patient has done well over the interim, no recent infections and currently asymptomatic. Taking Macrobid post coitally without any issues. Coffee and carbonated drinks serve as a trigger for dysuria. Has minor episode of right 'pinching' sensation, ?UTI however symptoms resolved shortly after a BM. States she is due to have a prolia injection today and believes this may be trigger for her infections. ?Microhematuria presence being a precursor for UTI's.  Denies flank pain, gross hematuria and dysuria. No f/c/n/v.     Has previously been treated with Cipro, Augmentin, Nitrofurantoin, Macrobid - admits UTI associated symptoms did not resolved with course of Cipro.     PRIOR VISIT:  12/30/15: She denies any infection since we last met in May 2016. She continues on Macrobid 100 mg post coitus.  She had a negative hematuria  work up in 2015.  No issues at this time. She denies any urinary frequency, urgency, incomplete bladder emptying.  She is asymptomatic for UTI today. No dysuria, gross hematuria. No f/c/n/v.     5//20/16: She presents today for follow up and management of recurrent infections.   Patient states that she recently finished Cipro and has not experiencing and symptoms associated with an infection. No current symptoms. Denies any dysuria, hematuria, or voiding symptoms.   No f/n/c/v.     01/2014 who presents today with complaint of dysuria, frequency, urgency cloudy urine, and pelvic pressure that resolved 1 week ago; these have resolved now. In October, Dr. SGeorgann Housekeeperperformed UA, provided patient Macrobid 100 mg to use post-coitally. She had it on hand while on a cruise in December; used Macrobid 12/01/14 - 12/08/13. She continued with symptoms until 01/16/14. She uses macrobid postcoitally.     01/21/14 Prior history obtained on 01/21/14: for reevaluation. She reports that she felt she had a UTI just prior to Christmas. UA and culture were negative. She reports the passage of small amts of sand. Reports RLQ pain which increases with caffeine. Reports it is an ache. Locates it to right suprapubic pain.  Occ vaginal spotting and using vaginal estrogen. Denies SUI.        REVIEW  OF SYSTEMS:     Constitutional: Fever: No  Skin: Rash: No  HEENT: Hearing difficulty: No  Eyes: Blurred vision: No  Cardiovascular: Chest pain: No  Respiratory: Shortness of breath: No  Gastrointestinal: Nausea/vomiting: No  Musculoskeletal: Back pain: No  Neurological: Weakness: No  Psychological: Memory loss: No  Comments/additional findings:       Past Medical History:   Diagnosis Date   ??? Cystocele    ??? Cystocele, unspecified    ??? High cholesterol    ??? Recurrent UTI    ??? Urinary frequency    ??? Urinary incontinence    ??? UTI (urinary tract infection)        Past Surgical History:   Procedure Laterality Date   ??? HX KNEE REPLACEMENT  2/20110     partial    ??? HX OTHER SURGICAL  2005    PLASTIC SURGERY   ??? HX OTHER SURGICAL  1971    FINE NEEDLE ASPIRATE, CYTOLOGY   ??? HX OTHER SURGICAL  12/20/2008    CYSTOCELE REPAIR SLING POST PROCEDURE ORDERSET 517   ??? HX POLYPECTOMY  2007   ??? HX UROLOGICAL  01/2013    bladder sling       Family History   Problem Relation Age of Onset   ??? Heart Disease Mother    ??? Heart Disease Father        Social History   Substance Use Topics   ??? Smoking status: Former Smoker   ??? Smokeless tobacco: Never Used   ??? Alcohol use Yes       Allergies   Allergen Reactions   ??? Alendronate Other (comments)   ??? Sulfa (Sulfonamide Antibiotics) Other (comments)     50 yrs ago   ??? Sulfasalazine Other (comments)     Other reaction(s): Dermatological problems, e.g., rash, hives       Current Outpatient Prescriptions   Medication Sig Dispense Refill   ??? conjugated estrogens (PREMARIN) 0.625 mg tablet Take  by mouth.     ??? fluticasone (FLONASE) 50 mcg/actuation nasal spray 2 Sprays by Both Nostrils route daily.     ??? Azelastine (ASTEPRO) 0.15 % (205.5 mcg) nasal spray two (2) times a day.     ??? denosumab (PROLIA) 60 mg/mL injection 60 mg by SubCUTAneous route.     ??? nitrofurantoin, macrocrystal-monohydrate, (MACROBID) 100 mg capsule Take 1 Cap by mouth nightly. 30 Cap 3   ??? amoxicillin-clavulanate (AUGMENTIN) 875-125 mg per tablet Take 1 tablet PO BID x5 days in the presence of UTI associated symptoms or when travelling. 10 Tab 2   ??? levothyroxine (SYNTHROID) 175 mcg tablet 0.075 mg.     ??? atorvastatin (LIPITOR) 10 mg tablet      ??? nitrofurantoin, macrocrystal-monohydrate, (MACROBID) 100 mg capsule Take 1 Cap by mouth nightly as needed. 30 Cap 3   ??? simvastatin (ZOCOR) 20 mg tablet Take  by mouth nightly.     ??? aspirin delayed-release 81 mg tablet Take  by mouth daily.     ??? raloxifene (EVISTA) 60 mg tablet Take  by mouth daily.     ??? cholecalciferol, vitamin D3, (VITAMIN D3) 2,000 unit tab Take  by mouth.      ??? multivitamin (ONE A DAY) tablet Take 1 Tab by mouth daily.     ??? glucosamine-chondroitin 750-600 mg chew Take  by mouth.     ??? amoxicillin-clavulanate (AUGMENTIN) 875-125 mg per tablet Take 1 Tab by mouth every twelve (12) hours. 10 Tab 0   ???  GLUCOSAMINE HCL (GLUCOSAMINE, BULK,) powd 1 Tab by Misc.(Non-Drug; Combo Route) route Once a Day.      ??? cetirizine (ZYRTEC) 10 mg tablet 10 mg.     ??? naproxen sodium (ALEVE) 220 mg tablet Take 220 mg by mouth two (2) times daily (with meals).     ??? estradiol (VAGIFEM) 10 mcg tab vaginal tablet Insert 10 mcg into vagina daily.         OB History     Gravida Para Term Preterm AB Living    '3 2 2 ' 0 1 0    SAB TAB Ectopic Molar Multiple Live Births    1 0 0  0         PHYSICAL EXAMINATION:     Visit Vitals   ??? BP 142/88   ??? Ht '5\' 5"'  (1.651 m)   ??? Wt 148 lb (67.1 kg)   ??? BMI 24.63 kg/m2     Constitutional: Well developed, well-nourished female in no acute distress.   CV:  No peripheral swelling noted  Respiratory: No respiratory distress or difficulties  Abdomen:  Soft and nontender. No masses.    GU Female:  deferred  Skin: No bruising or rashes.  No petechia.    Neuro/Psych:  Patient with appropriate affect.  Alert and oriented.    Lymphatic:   No enlargement of inguinal lymph nodes.    REVIEW OF LABS AND IMAGING:      Results for orders placed or performed in visit on 01/07/17   AMB POC URINALYSIS DIP STICK AUTO W/O MICRO   Result Value Ref Range    Color (UA POC) Yellow     Clarity (UA POC) Clear     Glucose (UA POC) Negative Negative    Bilirubin (UA POC) Negative Negative    Ketones (UA POC) Negative Negative    Specific gravity (UA POC) 1.015 1.001 - 1.035    Blood (UA POC) Trace Negative    pH (UA POC) 7.0 4.6 - 8.0    Protein (UA POC) Negative Negative    Urobilinogen (UA POC) 0.2 mg/dL 0.2 - 1    Nitrites (UA POC) Negative Negative    Leukocyte esterase (UA POC) Negative Negative     Prior labs or imaging:  none     A copy of today's office visit with all pertinent imaging results and labs were sent to the referring physician.      Michaell Cowing, M.D.,   [facility]  a division of [facility] [place]    [place], [address]  [phone] ext [place] fax    Medical Documentation is provided with the assistance of Baruch Merl, Medical Scribe for Brantley Fling, MD

## 2017-01-25 ENCOUNTER — Encounter: Attending: Urology

## 2017-02-15 ENCOUNTER — Encounter: Attending: Urology

## 2017-05-13 NOTE — Telephone Encounter (Signed)
The patient called the office and left a message on office vm informing us that she may possibly have a recurrent infection presently. She was called back to gain more information in regard to infection and a message was left on patient's vm advising her to call the office for possible appointment.

## 2017-05-13 NOTE — Telephone Encounter (Signed)
Pt called back after voicemail left on office phone. Pt states that she had intercourse a couple days ago and she took  the Macrobid that was prescribed by Dr. Wyvonnia DuskyMiles Thomas to prevent infection. Pt states that she also had a appointment with her dentist coming up so she took her Amoxicillin. Pt states that Saturday night she started to feel a dull pain in her pelvis area and that this morning she noticed her urine was cloudy. Pt was [facility] but patient declined and states that she wanted an appointment [facility]. Pt was told that Dr. Wyvonnia DuskyMiles-Thomas would not be there this Friday but we do have a NP here to see her tomorrow, pt declined. Pt states that she she was going out of town next Tuesday and that she thinks the antibiotics aren't working for her anymore. Pt was advised to call the [facility] to see if they would see her there. Pt verbalized understanding and the call ended.  Harrell LarkJade E Parker Wherley

## 2017-05-17 ENCOUNTER — Institutional Professional Consult (permissible substitution): Admit: 2017-05-17 | Discharge: 2017-05-17 | Payer: MEDICARE

## 2017-05-17 DIAGNOSIS — N3000 Acute cystitis without hematuria: Secondary | ICD-10-CM

## 2017-05-17 LAB — AMB POC URINALYSIS DIP STICK AUTO W/O MICRO
Bilirubin (UA POC): NEGATIVE
Glucose (UA POC): NEGATIVE
Ketones (UA POC): NEGATIVE
Leukocyte esterase (UA POC): NEGATIVE
Nitrites (UA POC): NEGATIVE
Protein (UA POC): NEGATIVE
Specific gravity (UA POC): 1.025 (ref 1.001–1.035)
Urobilinogen (UA POC): 0.2 (ref 0.2–1)
pH (UA POC): 5.5 (ref 4.6–8.0)

## 2017-05-17 NOTE — Progress Notes (Signed)
Michelle LeepJudith F Snyder is here today per Dr. Marvis MoellerMilesMaisie Fus- Snyder to give a urine specimen.       She is here for UA      Urine is obtained from patient via Clean Catch .     Patient is symptomatic: yes  Patient complains of: Pain in lower abdomen, cloudy urine    Patient denies f/n/v/c    Urine was sent for culture.   This is not a repeat urine culture.   UA performed: yes    Results for orders placed or performed in visit on 01/07/17   AMB POC URINALYSIS DIP STICK AUTO W/O MICRO   Result Value Ref Range    Color (UA POC) Yellow     Clarity (UA POC) Clear     Glucose (UA POC) Negative Negative    Bilirubin (UA POC) Negative Negative    Ketones (UA POC) Negative Negative    Specific gravity (UA POC) 1.015 1.001 - 1.035    Blood (UA POC) Trace Negative    pH (UA POC) 7.0 4.6 - 8.0    Protein (UA POC) Negative Negative    Urobilinogen (UA POC) 0.2 mg/dL 0.2 - 1    Nitrites (UA POC) Negative Negative    Leukocyte esterase (UA POC) Negative Negative        Urine was sent to [facility] for processing of urine culture.   Patient is informed that it will be at least 48 hours before results are available     Orders Placed This Encounter   ??? CULTURE, URINE   ??? AMB POC URINALYSIS DIP STICK AUTO W/O MICRO       Michelle RoutSharita Reginald Snyder   Reviewed by Joseph PieriniLawrence Volz, MD, FACS

## 2017-05-17 NOTE — Telephone Encounter (Signed)
Called patient to inform her of Urine results Per Dr. Kennon PortelaVolz does not look like UTI but will send out for culture, may take anti inflammatory for the pain . Patient Verbalized her understanding , patient stated she will be out of town starting on tues 05/21/17 until Saturday evening 05/24/17 she wanted the nursed to be aware in the case that she did need antibiotic .  Michelle Snyder

## 2017-05-19 LAB — CULTURE, URINE

## 2017-05-19 NOTE — Progress Notes (Signed)
Please let her know the urine culture is negative. Thank you!

## 2017-05-20 NOTE — Telephone Encounter (Addendum)
Called pt and informed of neg culture.     ----- Message from Kristin F [place], NP sent at 05/19/2017  9:56 PM EDT -----  Please let her know the urine culture is negative. Thank you!

## 2017-07-12 ENCOUNTER — Ambulatory Visit: Admit: 2017-07-12 | Discharge: 2017-07-12 | Payer: MEDICARE | Attending: Urology

## 2017-07-12 DIAGNOSIS — N390 Urinary tract infection, site not specified: Secondary | ICD-10-CM

## 2017-07-12 NOTE — Progress Notes (Signed)
ASSESSMENT:     ICD-10-CM ICD-9-CM    1. Recurrent UTI N39.0 599.0       PLAN:      1. Recurrent UTI  - Recently treated for C. Diff. On Vancomycin. If patient has another UTI would recommend Gent Instillations. She will travel with Macrobid. Encouraged patient to void frequently to help prevent infections. No UA today. Asymptomatic     2. Body mass index is 24.63 kg/(m^2). BMI is within the normal parameters.      Leaving in late August for a cruise. She will drop off a urine for culture 1 week prior    RTC 6 months, sooner if needed    DISCUSSION:      Chief Complaint   Patient presents with   ??? Recurrent UTI     Pt here today for f/u of recurrent uti, pt states that she did have one uti since her last visit, pt states that she was given an abx and then she had C.diff after taking the abx, pt would liker to know what to do when she gets a UTI to avoid getting C. Diff again.       History of Present Illness:  Michelle Snyder is a 76 y.o. female who presents for a recurrent UTI f/u. Recently had oral surgery and completed a course of Clindamycin. After completing the Clindamycin she reports she developed UTI symptoms, increased dysuria, frequency, urgency. Took a course of Augmentin she had at home. A few days later developed severe diarrhea. Went to the ER and was found to have C. Difficile. Treated with vancomycin. Diarrhea later returned and was put back on Vancomycin by her PCP, has one week left. Diarrhea has improved. Took Culturelle, but stopped Now on Saccharomyces boulardii + MOS with benefit. Leaving in late August for a cruise    01/07/17  presents today in follow up for recurrent UTI. Patient has done well over the interim, no recent infections and currently asymptomatic. Taking Macrobid post coitally without any issues. Coffee and carbonated drinks serve as a trigger for dysuria. Has minor episode of right 'pinching' sensation, ?UTI however symptoms resolved shortly after a BM. States she  is due to have a prolia injection today and believes this may be trigger for her infections. ?Microhematuria presence being a precursor for UTI's.  Denies flank pain, gross hematuria and dysuria. No f/c/n/v.     Has previously been treated with Cipro, Augmentin, Nitrofurantoin, Macrobid - admits UTI associated symptoms did not resolved with course of Cipro.     PRIOR VISIT:  12/30/15: She denies any infection since we last met in May 2016. She continues on Macrobid 100 mg post coitus.  She had a negative hematuria work up in 2015.  No issues at this time. She denies any urinary frequency, urgency, incomplete bladder emptying.  She is asymptomatic for UTI today. No dysuria, gross hematuria. No f/c/n/v.     5//20/16: She presents today for follow up and management of recurrent infections.   Patient states that she recently finished Cipro and has not experiencing and symptoms associated with an infection. No current symptoms. Denies any dysuria, hematuria, or voiding symptoms.   No f/n/c/v.     01/2014 who presents today with complaint of dysuria, frequency, urgency cloudy urine, and pelvic pressure that resolved 1 week ago; these have resolved now. In October, Dr. Georgann Housekeeper performed UA, provided patient Macrobid 100 mg to use post-coitally. She had it on hand while on a cruise  in December; used Macrobid 12/01/14 - 12/08/13. She continued with symptoms until 01/16/14. She uses macrobid postcoitally.     01/21/14 Prior history obtained on 01/21/14: for reevaluation. She reports that she felt she had a UTI just prior to Christmas. UA and culture were negative. She reports the passage of small amts of sand. Reports RLQ pain which increases with caffeine. Reports it is an ache. Locates it to right suprapubic pain.  Occ vaginal spotting and using vaginal estrogen. Denies SUI.        REVIEW OF SYSTEMS:     Constitutional: Fever: No  Skin: Rash: No  HEENT: Hearing difficulty: No  Eyes: Blurred vision: No   Cardiovascular: Chest pain: No  Respiratory: Shortness of breath: No  Gastrointestinal: Nausea/vomiting: No  Musculoskeletal: Back pain: No  Neurological: Weakness: No  Psychological: Memory loss: No  Comments/additional findings:       Past Medical History:   Diagnosis Date   ??? Cystocele    ??? Cystocele, unspecified    ??? High cholesterol    ??? Recurrent UTI    ??? Urinary frequency    ??? Urinary incontinence    ??? UTI (urinary tract infection)        Past Surgical History:   Procedure Laterality Date   ??? HX KNEE REPLACEMENT  2/20110    partial    ??? HX OTHER SURGICAL  2005    PLASTIC SURGERY   ??? HX OTHER SURGICAL  1971    FINE NEEDLE ASPIRATE, CYTOLOGY   ??? HX OTHER SURGICAL  12/20/2008    CYSTOCELE REPAIR SLING POST PROCEDURE ORDERSET 517   ??? HX POLYPECTOMY  2007   ??? HX UROLOGICAL  01/2013    bladder sling       Family History   Problem Relation Age of Onset   ??? Heart Disease Mother    ??? Heart Disease Father        Social History   Substance Use Topics   ??? Smoking status: Former Smoker   ??? Smokeless tobacco: Never Used   ??? Alcohol use Yes       Allergies   Allergen Reactions   ??? Alendronate Other (comments)   ??? Sulfa (Sulfonamide Antibiotics) Other (comments)     50 yrs ago   ??? Sulfasalazine Other (comments)     Other reaction(s): Dermatological problems, e.g., rash, hives       Current Outpatient Prescriptions   Medication Sig Dispense Refill   ??? conjugated estrogens (PREMARIN) 0.625 mg tablet Take  by mouth.     ??? fluticasone (FLONASE) 50 mcg/actuation nasal spray 2 Sprays by Both Nostrils route daily.     ??? Azelastine (ASTEPRO) 0.15 % (205.5 mcg) nasal spray two (2) times a day.     ??? denosumab (PROLIA) 60 mg/mL injection 60 mg by SubCUTAneous route.     ??? nitrofurantoin, macrocrystal-monohydrate, (MACROBID) 100 mg capsule Take 1 Cap by mouth nightly. 30 Cap 3   ??? amoxicillin-clavulanate (AUGMENTIN) 875-125 mg per tablet Take 1 tablet PO BID x5 days in the presence of UTI associated symptoms or when  travelling. 10 Tab 2   ??? amoxicillin-clavulanate (AUGMENTIN) 875-125 mg per tablet Take 1 Tab by mouth every twelve (12) hours. 10 Tab 0   ??? GLUCOSAMINE HCL (GLUCOSAMINE, BULK,) powd 1 Tab by Misc.(Non-Drug; Combo Route) route Once a Day.      ??? cetirizine (ZYRTEC) 10 mg tablet 10 mg.     ??? levothyroxine (SYNTHROID) 175 mcg tablet 0.075 mg.     ???  atorvastatin (LIPITOR) 10 mg tablet      ??? nitrofurantoin, macrocrystal-monohydrate, (MACROBID) 100 mg capsule Take 1 Cap by mouth nightly as needed. 30 Cap 3   ??? simvastatin (ZOCOR) 20 mg tablet Take  by mouth nightly.     ??? aspirin delayed-release 81 mg tablet Take  by mouth daily.     ??? raloxifene (EVISTA) 60 mg tablet Take  by mouth daily.     ??? cholecalciferol, vitamin D3, (VITAMIN D3) 2,000 unit tab Take  by mouth.     ??? multivitamin (ONE A DAY) tablet Take 1 Tab by mouth daily.     ??? glucosamine-chondroitin 750-600 mg chew Take  by mouth.     ??? naproxen sodium (ALEVE) 220 mg tablet Take 220 mg by mouth two (2) times daily (with meals).     ??? estradiol (VAGIFEM) 10 mcg tab vaginal tablet Insert 10 mcg into vagina daily.         OB History     Gravida Para Term Preterm AB Living    '3 2 2 ' 0 1 0    SAB TAB Ectopic Molar Multiple Live Births    1 0 0  0         PHYSICAL EXAMINATION:     Visit Vitals   ??? Ht '5\' 5"'  (1.651 m)   ??? Wt 148 lb (67.1 kg)   ??? BMI 24.63 kg/m2     Constitutional: Well developed, well-nourished female in no acute distress.   CV:  No peripheral swelling noted  Respiratory: No respiratory distress or difficulties  Abdomen:  Soft and nontender. No masses.    GU Female:  deferred  Skin: No bruising or rashes.  No petechia.    Neuro/Psych:  Patient with appropriate affect.  Alert and oriented.    Lymphatic:   No enlargement of inguinal lymph nodes.    REVIEW OF LABS AND IMAGING:      Results for orders placed or performed in visit on 05/17/17   CULTURE, URINE   Result Value Ref Range    Urine Culture, Routine       Mixed urogenital flora   Less than 10,000 colonies/mL     AMB POC URINALYSIS DIP STICK AUTO W/O MICRO   Result Value Ref Range    Color (UA POC) Yellow     Clarity (UA POC) Clear     Glucose (UA POC) Negative Negative    Bilirubin (UA POC) Negative Negative    Ketones (UA POC) Negative Negative    Specific gravity (UA POC) 1.025 1.001 - 1.035    Blood (UA POC) Trace Negative    pH (UA POC) 5.5 4.6 - 8.0    Protein (UA POC) Negative Negative    Urobilinogen (UA POC) 0.2 mg/dL 0.2 - 1    Nitrites (UA POC) Negative Negative    Leukocyte esterase (UA POC) Negative Negative    WBC (POC) rare K/uL    RBC rare      Prior labs or imaging:  Urine culture  06/03/2017 10:45 AM - Demetrius Charity, [place], LAB TECH     Culture   Mixed Culture     A copy of today's office visit with all pertinent imaging results and labs were sent to the referring physician.      Michaell Cowing, M.D.,   [facility]  a division of [facility] [place]    [place], [place]  [address]  [phone] ext [phone]  [phone] fax    Documentation was provided with the assistance of Corliss Marcus, medical scribe for Brantley Fling, MD on 07/12/2017.

## 2017-07-19 ENCOUNTER — Institutional Professional Consult (permissible substitution): Admit: 2017-07-19 | Discharge: 2017-07-19 | Payer: MEDICARE

## 2017-07-19 DIAGNOSIS — N3000 Acute cystitis without hematuria: Secondary | ICD-10-CM

## 2017-07-19 LAB — AMB POC URINALYSIS DIP STICK AUTO W/O MICRO
Bilirubin (UA POC): NEGATIVE
Blood (UA POC): NEGATIVE
Glucose (UA POC): NEGATIVE
Ketones (UA POC): NEGATIVE
Leukocyte esterase (UA POC): NEGATIVE
Nitrites (UA POC): NEGATIVE
Protein (UA POC): NEGATIVE
Specific gravity (UA POC): 1.02 (ref 1.001–1.035)
Urobilinogen (UA POC): 0.2 (ref 0.2–1)
pH (UA POC): 6 (ref 4.6–8.0)

## 2017-07-19 NOTE — Progress Notes (Signed)
Michelle Snyder is here today per Dr. Wyvonnia Dusky to give a urine specimen.       She is here for UA     Urine is obtained from patient via Clean Catch.     Patient is symptomatic: no  Patient complains of: Urinary symptoms   Patient denies F/N/V/C    Urine was sent for culture.   This is not a repeat urine culture.   UA performed: yes    Results for orders placed or performed in visit on 05/17/17   CULTURE, URINE   Result Value Ref Range    Urine Culture, Routine       Mixed urogenital flora  Less than 10,000 colonies/mL     AMB POC URINALYSIS DIP STICK AUTO W/O MICRO   Result Value Ref Range    Color (UA POC) Yellow     Clarity (UA POC) Clear     Glucose (UA POC) Negative Negative    Bilirubin (UA POC) Negative Negative    Ketones (UA POC) Negative Negative    Specific gravity (UA POC) 1.025 1.001 - 1.035    Blood (UA POC) Trace Negative    pH (UA POC) 5.5 4.6 - 8.0    Protein (UA POC) Negative Negative    Urobilinogen (UA POC) 0.2 mg/dL 0.2 - 1    Nitrites (UA POC) Negative Negative    Leukocyte esterase (UA POC) Negative Negative    WBC (POC) rare K/uL    RBC rare         Urine was sent to [facility] for processing of urine culture.   Patient is informed that it will be at least 48 hours before results are available     Orders Placed This Encounter   ??? URINE C&S     Order Specific Question:   Specify all ANTIBIOTIC ALLERGIES:     Answer:   Sulfa (Bactrim)     Order Specific Question:   Specify the urine source     Answer:   Self Cath   ??? AMB POC URINALYSIS DIP STICK AUTO W/O MICRO       Orie Rout

## 2017-07-19 NOTE — Progress Notes (Signed)
Lab received culture plate from [facility] and order needed to be released. I went in and released patients order. Per JMT note Urine culture to be done 1 week prior to patient leaving on vacation.

## 2017-07-21 LAB — URINE C&S

## 2017-07-22 ENCOUNTER — Encounter

## 2017-07-22 NOTE — Telephone Encounter (Signed)
Patient notified of her culture results and that is a very low colony count and if she is not symptomatic we don't want to treat her. Per patient she is asymptomatic and we will not treat. Patient voiced understanding.

## 2017-08-14 ENCOUNTER — Institutional Professional Consult (permissible substitution): Admit: 2017-08-14 | Discharge: 2017-08-14 | Payer: MEDICARE

## 2017-08-14 DIAGNOSIS — N3000 Acute cystitis without hematuria: Secondary | ICD-10-CM

## 2017-08-14 LAB — AMB POC URINALYSIS DIP STICK AUTO W/ MICRO (MICRO RESULTS)
Bilirubin (UA POC): NEGATIVE
Epithelial cells (UA POC): 0
Glucose (UA POC): NEGATIVE
Ketones (UA POC): NEGATIVE
Nitrites (UA POC): POSITIVE
RBCs (UA POC): 0
Specific gravity (UA POC): 1.02 (ref 1.001–1.035)
Urobilinogen (UA POC): 0.2 (ref 0.2–1)
pH (UA POC): 8.5 — AB (ref 4.6–8.0)

## 2017-08-14 NOTE — Telephone Encounter (Signed)
Called patient to inform her that DR.Kennon PortelaVolz looked at urine urine will be sent for culture does look like UTI. Patient requesting bladder instillations oppose to antibiotics. Made patient aware will forward [facility].  Orie RoutSharita [place] Devine

## 2017-08-14 NOTE — Progress Notes (Signed)
Truitt LeepJudith F Snyder is here today per Dr. Kennon PortelaVolz to give a urine specimen.       She is here for UA      Urine is obtained from patient via Clean Catch.     Patient is symptomatic: yes  Patient complains of: Burning with urination ,frequency,   Patient denies F/N/V/C    Urine was sent for culture.   This is not a repeat urine culture.   UA performed: yes    Results for orders placed or performed in visit on 08/14/17   AMB POC URINALYSIS DIP STICK AUTO W/ MICRO (MICRO RESULTS)   Result Value Ref Range    Color (UA POC) Yellow     Clarity (UA POC) Clear     Glucose (UA POC) Negative Negative    Bilirubin (UA POC) Negative Negative    Ketones (UA POC) Negative Negative    Specific gravity (UA POC) 1.020 1.001 - 1.035    Blood (UA POC) 2+ Negative    pH (UA POC) 8.5 (A) 4.6 - 8.0    Protein (UA POC) 1+ Negative    Urobilinogen (UA POC) 0.2 mg/dL 0.2 - 1    Nitrites (UA POC) Positive Negative    Leukocyte esterase (UA POC) 3+ Negative    Epithelial cells (UA POC) 0     WBC (POC) 2-3 K/uL    RBCs (UA POC) 0      Bacteria some     Crystals (UA POC)  Negative    Other (UA POC)          Urine was sent to [facility] for processing of urine culture.   Patient is informed that it will be at least 48 hours before results are available     Orders Placed This Encounter   ??? CULTURE, URINE   ??? AMB POC URINALYSIS DIP STICK AUTO W/ MICRO (MICRO RESULTS)       Michelle RoutSharita Yamir Snyder   Reviewed by Michelle PieriniLawrence Volz, MD, FACS

## 2017-08-18 LAB — CULTURE, URINE

## 2017-08-19 MED ORDER — AMOXICILLIN 500 MG CAP
500 mg | ORAL_CAPSULE | Freq: Two times a day (BID) | ORAL | 0 refills | Status: AC
Start: 2017-08-19 — End: 2017-08-24

## 2017-08-19 NOTE — Progress Notes (Signed)
Let her know the culture was positive. Begin gent bladder instillations 160 mg daily x 5 days in [place].     JMT

## 2017-08-19 NOTE — Telephone Encounter (Signed)
Patient notified of positive urine culture and needs to start gentamicin x 5 day the patient states that she is going out of town this week, per Dr. Wyvonnia DuskyMiles-Thomas we can start her on amoxicillin 500mg  bid x 5 days. Patient states that she will call if she develops symptoms of C-Diff. Rx sent to pharmacy.

## 2017-08-28 ENCOUNTER — Encounter

## 2017-08-29 ENCOUNTER — Institutional Professional Consult (permissible substitution): Admit: 2017-08-29 | Discharge: 2017-08-29 | Payer: MEDICARE

## 2017-08-29 DIAGNOSIS — N3000 Acute cystitis without hematuria: Secondary | ICD-10-CM

## 2017-08-29 LAB — AMB POC URINALYSIS DIP STICK AUTO W/O MICRO
Bilirubin (UA POC): NEGATIVE
Glucose (UA POC): NEGATIVE
Ketones (UA POC): NEGATIVE
Nitrites (UA POC): NEGATIVE
Specific gravity (UA POC): 1.02 (ref 1.001–1.035)
Urobilinogen (UA POC): 0.2 (ref 0.2–1)
pH (UA POC): 6 (ref 4.6–8.0)

## 2017-08-29 NOTE — Progress Notes (Signed)
Michelle LeepJudith F Mceuen is a 76 y.o. female presents today for a straight catheter insertion for urine culture per Dr. Kennon PortelaVolz order.   Dr. Kennon PortelaVolz was present in the clinic as incident to.     Visit Vitals   ??? Temp 98.3 ??F (36.8 ??C)   ??? Ht 5\' 5"  (1.651 m)   ??? Wt 148 lb (67.1 kg)   ??? BMI 24.63 kg/m2        Procedure:   The genital and perineal areas were cleansed with antiseptic solution on the cotton balls.  With clean gloves the area was cleansed with antiseptic solution again.  I applied sterile lubricant liberally to the catheter tip, lubricating at least six inches of the catheter.  A 14 fr cath catheter was inserted into the urinary meatus. When urine started to flow, I inserted the catheter approximately one inch further without difficulty and bladder drained.      Catheter size:  14  Type:  Straight-tip   Material:  Latex    Urine was:   clear  Specimen obtained:  YES    Results for orders placed or performed in visit on 08/29/17   URINE C&S   Result Value Ref Range    FINAL REPORT Microbiology results (A)    AMB POC URINALYSIS DIP STICK AUTO W/O MICRO   Result Value Ref Range    Color (UA POC) Yellow     Clarity (UA POC) Clear     Glucose (UA POC) Negative Negative    Bilirubin (UA POC) Negative Negative    Ketones (UA POC) Negative Negative    Specific gravity (UA POC) 1.020 1.001 - 1.035    Blood (UA POC) 2+ Negative    pH (UA POC) 6.0 4.6 - 8.0    Protein (UA POC) Trace Negative    Urobilinogen (UA POC) 0.2 mg/dL 0.2 - 1    Nitrites (UA POC) Negative Negative    Leukocyte esterase (UA POC) 3+ Negative       Orders Placed This Encounter   ??? URINE C&S     Order Specific Question:   Specify all ANTIBIOTIC ALLERGIES:     Answer:   Sulfa (Bactrim)     Order Specific Question:   Specify the urine source     Answer:   Straight Cath In/Out   ??? AMB POC URINALYSIS DIP STICK AUTO W/O MICRO   ??? INSERT,NON-INDWELLING BLADDER CATHETER   ??? A4310 - INSERT TRAY W/O BAG/CATH   ??? A4351 - STRAIGHT TIP URINE CATHETER        Aden Sek J Bina Veenstra     Reviewed by Joseph PieriniLawrence Volz, MD, FACS

## 2017-08-31 LAB — URINE C&S

## 2017-09-02 NOTE — Telephone Encounter (Signed)
Pt called and was looking to see what her urine culture was.  She did mention that she took a Macrobid and it seemed to help with the cloudy urine and pain that she was experiencing.  Can you please call her back and let her know the results.  Her number is [phone].  Thank you!

## 2017-09-03 MED ORDER — AMOXICILLIN-CLAVULANATE 500 MG-125 MG TAB
500-125 mg | ORAL_TABLET | Freq: Two times a day (BID) | ORAL | 0 refills | Status: AC
Start: 2017-09-03 — End: 2017-09-08

## 2017-09-03 NOTE — Telephone Encounter (Signed)
Called pt and got vm  Lm to call [facility] back. Please informed pt of positive culture and Abx sent to pharmacy.     Augmentin 500mg  PO BID x 5days.

## 2017-09-03 NOTE — Telephone Encounter (Signed)
Patient returned call to the office and per patient she does not want to take oral antibiotics and would prefer to have Gent instillations into the bladder per in office visit discussed in August. Patient would like to have a returned call as soon as her request and concern has been discussed with JMT.      Message sent to Suzan Garibaldiarolyn Clark, CMA

## 2017-09-05 NOTE — Telephone Encounter (Signed)
Pt called to inform her of gent instillation per Amado NashKristin Austria,NP. Got vm lm to call [facility] back. Please inform pt of Gent instillation and schedule [place] due [place] provider  [facility].     Eben BurowKristin F [place], NP   to Me         ?? 10:45 AM    Yes she can start gent bladder instillations 160 mg daily x 5 days in [place].   Thanks,   SUPERVALU INCKFA

## 2017-09-09 ENCOUNTER — Institutional Professional Consult (permissible substitution): Admit: 2017-09-09 | Discharge: 2017-09-09 | Payer: MEDICARE

## 2017-09-09 DIAGNOSIS — N3000 Acute cystitis without hematuria: Secondary | ICD-10-CM

## 2017-09-09 MED ORDER — GENTAMICIN 40 MG/ML IJ SOLN
40 mg/mL | Freq: Once | INTRAMUSCULAR | 0 refills | Status: AC
Start: 2017-09-09 — End: 2017-09-09

## 2017-09-09 NOTE — Progress Notes (Signed)
Michelle Snyder is a 76 y.o. female presents today for Gentamicin bladder instillation  per Dr. Wyvonnia Dusky order.   Dr. Nicanor Alcon  was present in the clinic as incident to.     Visit Vitals   ??? BP 148/88   ??? Ht  (1.626 m)   ??? Wt 134 lb 12.8 oz (61.1 kg)   ??? BMI 23.14 kg/m2        Chief Complaint   Patient presents with   ??? UTI       Associated signs and symptoms:    burning      Procedure:   The genital and perineal areas were cleansed with antiseptic solution on the cotton balls.  With clean gloves the area was cleansed with antiseptic solution again.  I applied sterile lubricant liberally to the catheter tip, lubricating at least six inches of the catheter.  A 14 fr cath catheter was inserted into the urinary meatus. When urine started to flow, I inserted the catheter approximately one inch further without difficulty and bladder drained.   Clear urine returned.     A combination 100 cc's of nomral saline and 160 mg of Gentamicin was instilled into the patient's bladder. Patient will retain this instillation for about one hour and then can void.     Patient tolerated the procedure without incident.      Dosage:    .    Instilled by: Dorian Furnace, RMA    Catheter size:  14  Type:  Straight-tip    Material:  All Silicone    Urine was:   clear  Specimen obtained:  NO    Results for orders placed or performed in visit on 08/29/17   URINE C&S   Result Value Ref Range    FINAL REPORT Microbiology results (A)    AMB POC URINALYSIS DIP STICK AUTO W/O MICRO   Result Value Ref Range    Color (UA POC) Yellow     Clarity (UA POC) Clear     Glucose (UA POC) Negative Negative    Bilirubin (UA POC) Negative Negative    Ketones (UA POC) Negative Negative    Specific gravity (UA POC) 1.020 1.001 - 1.035    Blood (UA POC) 2+ Negative    pH (UA POC) 6.0 4.6 - 8.0    Protein (UA POC) Trace Negative    Urobilinogen (UA POC) 0.2 mg/dL 0.2 - 1    Nitrites (UA POC) Negative Negative     Leukocyte esterase (UA POC) 3+ Negative       Orders Placed This Encounter   ??? GARAMYCIN, GENTAMICIN INJECTION <= 80 MG     Order Specific Question:   Charge Quantity?     Answer:   2     Order Specific Question:   Dose     Answer:   160 mg     Order Specific Question:   Site     Answer:   BLADDER     Order Specific Question:   Expiration Date     Answer:   07/01/2018     Order Specific Question:   Lot#     Answer:   [id number]     Order Specific Question:   Manufacturer     Answer:   [employer] kabi     Order Specific Question:   Perfomed by/Witnessed by:     Answer:   vnorfleet     Order Specific Question:   NDC#     Answer:  [id number] [[id number]]   ??? PR IRRIGATION OF BLADDER   ??? therapeutic multivitamin-minerals (VITAMINS AND MINERALS) tablet     Sig: Take 1 Tab by mouth.   ??? gentamicin (GARAMYCIN) 40 mg/mL injection     Sig: 2 mL by IntraMUSCular route once for 1 dose.     Dispense:  1 Vial     Refill:  0       Dorian Furnace, RMA   Reviewed by Leone Payor, MD 09/10/2017

## 2017-09-09 NOTE — Progress Notes (Deleted)
HISTORY OF PRESENT ILLNESS  Michelle Snyder is a 76 y.o. female.  HPI    ROS    Physical Exam    ASSESSMENT and PLAN  {ASSESSMENT/PLAN:[id number]}

## 2017-09-10 ENCOUNTER — Institutional Professional Consult (permissible substitution): Admit: 2017-09-10 | Discharge: 2017-09-10 | Payer: MEDICARE

## 2017-09-10 DIAGNOSIS — N3000 Acute cystitis without hematuria: Secondary | ICD-10-CM

## 2017-09-10 MED ORDER — GENTAMICIN 40 MG/ML IJ SOLN
40 mg/mL | INTRAMUSCULAR | 0 refills | Status: DC
Start: 2017-09-10 — End: 2018-11-10

## 2017-09-10 NOTE — Progress Notes (Signed)
Michelle Snyder is a 76 y.o. female presents today for Gentamicin bladder instillation  per Dr. Marvis Moeller -[name] order.   Dr. Kennon Portela was present in the clinic as incident to.     Visit Vitals   ??? Temp 97.3 ??F (36.3 ??C)   ??? Ht  (1.626 m)   ??? Wt 134 lb (60.8 kg)   ??? BMI 23 kg/m2        No chief complaint on file.      Associated signs and symptoms:    none      Procedure:   The genital and perineal areas were cleansed with antiseptic solution on the cotton balls.  With clean gloves the area was cleansed with antiseptic solution again.  I applied sterile lubricant liberally to the catheter tip, lubricating at least six inches of the catheter.  A 16 fr cath catheter was inserted into the urinary meatus. When urine started to flow, I inserted the catheter approximately one inch further without difficulty and bladder drained.   Clear urine returned.     A combination 100 cc's of nomral saline and 160 mg of Gentamicin was instilled into the patient's bladder. Patient will retain this instillation for about one hour and then can void.     Patient tolerated the procedure without incident.      Dosage:    .    Instilled by: Orie Rout    Catheter size:  16  Type:  Straight-tip    Material:  Latex    Urine was:   clear  Specimen obtained:  NO    Results for orders placed or performed in visit on 08/29/17   URINE C&S   Result Value Ref Range    FINAL REPORT Microbiology results (A)    AMB POC URINALYSIS DIP STICK AUTO W/O MICRO   Result Value Ref Range    Color (UA POC) Yellow     Clarity (UA POC) Clear     Glucose (UA POC) Negative Negative    Bilirubin (UA POC) Negative Negative    Ketones (UA POC) Negative Negative    Specific gravity (UA POC) 1.020 1.001 - 1.035    Blood (UA POC) 2+ Negative    pH (UA POC) 6.0 4.6 - 8.0    Protein (UA POC) Trace Negative    Urobilinogen (UA POC) 0.2 mg/dL 0.2 - 1    Nitrites (UA POC) Negative Negative    Leukocyte esterase (UA POC) 3+ Negative       Orders Placed This Encounter    ??? THER/PROPH/DIAG INJECTION, SUBCUT/IM   ??? GARAMYCIN, GENTAMICIN INJECTION <= 80 MG     Order Specific Question:   Charge Quantity?     Answer:   2     Order Specific Question:   Dose     Answer:        Order Specific Question:   Site     Answer:   BLADDER     Order Specific Question:   Expiration Date     Answer:   07/03/2018     Order Specific Question:   Lot#     Answer:   [id number]     Order Specific Question:   Manufacturer     Answer:   [employer]     Order Specific Question:   Perfomed by/Witnessed by:     Answer:   Orie Rout     Order Specific Question:   NDC#     Answer:   [id number] [  [id number]]   ??? gentamicin (GARAMYCIN) 40 mg/mL injection     Sig: Instill in Bladder     Dispense:  2 Vial     Refill:  0       Orie RoutSharita Lillianna Sabel   Reviewed by Joseph PieriniLawrence Volz, MD, FACS

## 2017-09-11 ENCOUNTER — Encounter

## 2017-09-11 ENCOUNTER — Institutional Professional Consult (permissible substitution): Admit: 2017-09-11 | Discharge: 2017-09-11 | Payer: MEDICARE

## 2017-09-11 DIAGNOSIS — N3000 Acute cystitis without hematuria: Secondary | ICD-10-CM

## 2017-09-11 MED ORDER — GENTAMICIN 40 MG/ML IJ SOLN
40 mg/mL | INTRAMUSCULAR | 0 refills | Status: DC
Start: 2017-09-11 — End: 2018-11-10

## 2017-09-11 NOTE — Addendum Note (Signed)
Addended by: Orie Rout on: 09/11/2017 10:38 AM      Modules accepted: Orders

## 2017-09-11 NOTE — Progress Notes (Signed)
Michelle Snyder is a 76 y.o. female presents today for Gentamicin bladder instillation  Per Dr. Marvis Moeller- [name] order.   Dr. Kennon Portela was present in the clinic as incident to.     There were no vitals taken for this visit.     Chief Complaint   Patient presents with   ??? UTI       Associated signs and symptoms:    none      Procedure:   The genital and perineal areas were cleansed with antiseptic solution on the cotton balls.  With clean gloves the area was cleansed with antiseptic solution again.  I applied sterile lubricant liberally to the catheter tip, lubricating at least six inches of the catheter.  A 16 fr cath catheter was inserted into the urinary meatus. When urine started to flow, I inserted the catheter approximately one inch further without difficulty and bladder drained.   Clear urine returned.     A combination 100 cc's of nomral saline and 160 mg of Gentamicin was instilled into the patient's bladder. Patient will retain this instillation for about one hour and then can void.     Patient tolerated the procedure without incident.      Dosage:    .    Instilled by: Orie Rout    Catheter size:  16  Type:  Straight-tip    Material:  Latex    Urine was:   clear  Specimen obtained:  NO    Results for orders placed or performed in visit on 08/29/17   URINE C&S   Result Value Ref Range    FINAL REPORT Microbiology results (A)    AMB POC URINALYSIS DIP STICK AUTO W/O MICRO   Result Value Ref Range    Color (UA POC) Yellow     Clarity (UA POC) Clear     Glucose (UA POC) Negative Negative    Bilirubin (UA POC) Negative Negative    Ketones (UA POC) Negative Negative    Specific gravity (UA POC) 1.020 1.001 - 1.035    Blood (UA POC) 2+ Negative    pH (UA POC) 6.0 4.6 - 8.0    Protein (UA POC) Trace Negative    Urobilinogen (UA POC) 0.2 mg/dL 0.2 - 1    Nitrites (UA POC) Negative Negative    Leukocyte esterase (UA POC) 3+ Negative       Orders Placed This Encounter   ??? THER/PROPH/DIAG INJECTION, SUBCUT/IM    ??? GARAMYCIN, GENTAMICIN INJECTION &lt;= 80 MG     Order Specific Question:   Charge Quantity?     Answer:   2     Order Specific Question:   Dose     Answer:        Order Specific Question:   Site     Answer:   BLADDER     Order Specific Question:   Expiration Date     Answer:   07/03/2018     Order Specific Question:   Lot#     Answer:   [id number]     Order Specific Question:   Manufacturer     Answer:   [employer]     Order Specific Question:   Perfomed by/Witnessed by:     Answer:   Orie Rout     Order Specific Question:   NDC#     Answer:   [id number] [[id number]]   ??? PR IRRIGATION OF BLADDER   ??? gentamicin (GARAMYCIN) 40 mg/mL injection  Sig: Instill in Bladder     Dispense:  2 Vial     Refill:  0       Orie Rout   Reviewed by Joseph Pierini, MD, FACS

## 2017-09-12 ENCOUNTER — Institutional Professional Consult (permissible substitution): Admit: 2017-09-12 | Discharge: 2017-09-12 | Payer: MEDICARE

## 2017-09-12 ENCOUNTER — Encounter

## 2017-09-12 DIAGNOSIS — N3000 Acute cystitis without hematuria: Secondary | ICD-10-CM

## 2017-09-12 LAB — AMB POC URINALYSIS DIP STICK AUTO W/O MICRO
Bilirubin (UA POC): NEGATIVE
Blood (UA POC): NEGATIVE
Glucose (UA POC): NEGATIVE
Ketones (UA POC): NEGATIVE
Leukocyte esterase (UA POC): NEGATIVE
Nitrites (UA POC): NEGATIVE
Protein (UA POC): NEGATIVE
Specific gravity (UA POC): 1.015 (ref 1.001–1.035)
Urobilinogen (UA POC): 0.2 (ref 0.2–1)
pH (UA POC): 7.5 (ref 4.6–8.0)

## 2017-09-12 MED ORDER — GENTAMICIN 40 MG/ML IJ SOLN
40 mg/mL | Freq: Once | INTRAMUSCULAR | 0 refills | Status: AC
Start: 2017-09-12 — End: 2017-09-12

## 2017-09-12 NOTE — Progress Notes (Signed)
Michelle Snyder presents today for [facility] instillation # 4 of 5  Patient is receiving Michelle Snyder Treatments every day per the orders of Dr. Lanier Ensign.  Dr. Kennon Portela is in the office as incident to provider.     Chief Complaint   Patient presents with   ??? UTI       Associated signs and symptoms:    none    Patient c/o odor to urine NO  Decreased output: NO  Difficulty voiding: NO  Burning with urination: NO  Incontinence:  NO  Split [place]: NO    Procedure:   The genital and perineal areas were cleansed with antiseptic solution on the cotton balls.  With clean gloves the area was cleansed with antiseptic solution again.  I applied sterile lubricant liberally to the catheter tip, lubricating at least six inches of the catheter.  A 16 fr cath catheter was inserted into the urinary meatus. When urine started to flow, I inserted the catheter approximately one inch further without difficulty and bladder drained.   Clear urine returned.    The Dosage combination of:  100 ml of Sodium Chloride and 160 mg of Gent was instilled into the patient's bladder. Patient will retain this instillation for about one hour and then can void.      Patient tolerated the procedure without incident.      Catheter size:  16  Type:  Straight-tip    Material:  Latex    Urine sent for culture: NO     Results for orders placed or performed in visit on 09/12/17   AMB POC URINALYSIS DIP STICK AUTO W/O MICRO   Result Value Ref Range    Color (UA POC) Yellow     Clarity (UA POC) Clear     Glucose (UA POC) Negative Negative    Bilirubin (UA POC) Negative Negative    Ketones (UA POC) Negative Negative    Specific gravity (UA POC) 1.015 1.001 - 1.035    Blood (UA POC) Negative Negative    pH (UA POC) 7.5 4.6 - 8.0    Protein (UA POC) Negative Negative    Urobilinogen (UA POC) 0.2 mg/dL 0.2 - 1    Nitrites (UA POC) Negative Negative    Leukocyte esterase (UA POC) Negative Negative       Encounter Diagnosis   Name Primary?   ??? Acute cystitis without hematuria Yes        Orders Placed This Encounter   ??? AMB POC URINALYSIS DIP STICK AUTO W/O MICRO   ??? THER/PROPH/DIAG INJECTION, SUBCUT/IM   ??? GARAMYCIN, GENTAMICIN INJECTION <= 80 MG     Order Specific Question:   Charge Quantity?     Answer:   2     Order Specific Question:   Dose     Answer:        Order Specific Question:   Site     Answer:   BLADDER     Order Specific Question:   Expiration Date     Answer:   07/03/2018     Order Specific Question:   Lot#     Answer:   [id number]     Order Specific Question:   Manufacturer     Answer:   [employer]     Order Specific Question:   Perfomed by/Witnessed by:     Answer:   Rema Jasmine     Order Specific Question:   NDC#     Answer:   [phone] [[id number]]   ???  gentamicin (GARAMYCIN) 40 mg/mL injection     Sig: 4 mL by IntraMUSCular route once for 1 dose.     Dispense:  4 mL     Refill:  0         Maisyn Nouri J Roni Scow

## 2017-09-13 ENCOUNTER — Encounter

## 2017-09-13 ENCOUNTER — Institutional Professional Consult (permissible substitution): Admit: 2017-09-13 | Discharge: 2017-09-13 | Payer: MEDICARE

## 2017-09-13 DIAGNOSIS — N3000 Acute cystitis without hematuria: Secondary | ICD-10-CM

## 2017-09-13 MED ORDER — GENTAMICIN 40 MG/ML IJ SOLN
40 mg/mL | INTRAMUSCULAR | 0 refills | Status: DC
Start: 2017-09-13 — End: 2018-11-10

## 2017-09-13 NOTE — Progress Notes (Addendum)
Michelle Snyder presents today for Heparin instillation # 5 of 5  Patient is receiving Gent Treatments every day per the orders of Dr. Lanier Ensign.  Dr. Kennon Portela is in the office as incident to provider.     Chief Complaint   Patient presents with   ??? UTI       Associated signs and symptoms:    none    Patient c/o odor to urine NO  Decreased output: NO  Difficulty voiding: NO  Burning with urination: NO  Incontinence:  NO  Split Stream: NO    Procedure:   The genital and perineal areas were cleansed with antiseptic solution on the cotton balls.  With clean gloves the area was cleansed with antiseptic solution again.  I applied sterile lubricant liberally to the catheter tip, lubricating at least six inches of the catheter.  A 16 fr cath catheter was inserted into the urinary meatus. When urine started to flow, I inserted the catheter approximately one inch further without difficulty and bladder drained.   Clear urine returned.    The Dosage combination of:  100 ml of Sodium Chloride and 160 mg of Gent was instilled into the patient's bladder. Patient will retain this instillation for about one hour and then can void.      Patient tolerated the procedure without incident.      Catheter size:  16  Type:  Straight-tip    Material:  Latex    Urine sent for culture: NO      Results for orders placed or performed in visit on 09/12/17   AMB POC URINALYSIS DIP STICK AUTO W/O MICRO   Result Value Ref Range    Color (UA POC) Yellow     Clarity (UA POC) Clear     Glucose (UA POC) Negative Negative    Bilirubin (UA POC) Negative Negative    Ketones (UA POC) Negative Negative    Specific gravity (UA POC) 1.015 1.001 - 1.035    Blood (UA POC) Negative Negative    pH (UA POC) 7.5 4.6 - 8.0    Protein (UA POC) Negative Negative    Urobilinogen (UA POC) 0.2 mg/dL 0.2 - 1    Nitrites (UA POC) Negative Negative    Leukocyte esterase (UA POC) Negative Negative       Encounter Diagnosis   Name Primary?   ??? Acute cystitis without hematuria Yes        Orders Placed This Encounter   ??? THER/PROPH/DIAG INJECTION, SUBCUT/IM   ??? GARAMYCIN, GENTAMICIN INJECTION &lt;= 80 MG     Order Specific Question:   Charge Quantity?     Answer:   2     Order Specific Question:   Dose     Answer:   160 mf     Order Specific Question:   Site     Answer:   BLADDER     Order Specific Question:   Expiration Date     Answer:   07/03/2018     Order Specific Question:   Lot#     Answer:   [id number]     Order Specific Question:   Manufacturer     Answer:   [employer]     Order Specific Question:   Perfomed by/Witnessed by:     Answer:   Rema Jasmine     Order Specific Question:   NDC#     Answer:   [id number] [[id number]]   ??? INSERT,NON-INDWELLING BLADDER CATHETER   ??? A4310 -  INSERT TRAY W/O BAG/CATH   ??? A4351 - STRAIGHT TIP URINE CATHETER   ??? PR IRRIGATION OF BLADDER   ??? gentamicin (GARAMYCIN) 40 mg/mL injection     Sig: Instill in Bladder     Dispense:  1 Vial     Refill:  0         Malon Branton J Indie Nickerson           Reviewed by Joseph Pierini, MD, FACS

## 2017-09-20 ENCOUNTER — Institutional Professional Consult (permissible substitution): Admit: 2017-09-20 | Discharge: 2017-09-20 | Payer: MEDICARE

## 2017-09-20 DIAGNOSIS — N3000 Acute cystitis without hematuria: Secondary | ICD-10-CM

## 2017-09-20 LAB — AMB POC URINALYSIS DIP STICK AUTO W/O MICRO
Bilirubin (UA POC): NEGATIVE
Blood (UA POC): NEGATIVE
Glucose (UA POC): NEGATIVE
Ketones (UA POC): NEGATIVE
Leukocyte esterase (UA POC): NEGATIVE
Nitrites (UA POC): NEGATIVE
Protein (UA POC): NEGATIVE
Specific gravity (UA POC): 1.015 (ref 1.001–1.035)
Urobilinogen (UA POC): 0.2 (ref 0.2–1)
pH (UA POC): 6 (ref 4.6–8.0)

## 2017-09-20 NOTE — Progress Notes (Signed)
Truitt LeepJudith F Snyder is here today per Dr. Kennon PortelaVolz to give a urine specimen.       She is here for repeat culture      Urine is obtained from patient via clean catch.     Patient is symptomatic: no  Patient complains of: none   Patient denies n/a    Urine was sent for culture.   This is a repeat urine culture.   UA performed: yes    Results for orders placed or performed in visit on 09/20/17   AMB POC URINALYSIS DIP STICK AUTO W/O MICRO   Result Value Ref Range    Color (UA POC) Yellow     Clarity (UA POC) Clear     Glucose (UA POC) Negative Negative    Bilirubin (UA POC) Negative Negative    Ketones (UA POC) Negative Negative    Specific gravity (UA POC) 1.015 1.001 - 1.035    Blood (UA POC) Negative Negative    pH (UA POC) 6.0 4.6 - 8.0    Protein (UA POC) Negative Negative    Urobilinogen (UA POC) 0.2 mg/dL 0.2 - 1    Nitrites (UA POC) Negative Negative    Leukocyte esterase (UA POC) Negative Negative        [facility]   Patient is informed that it will be at least 48 hours before results are available     Orders Placed This Encounter   ??? CULTURE, URINE   ??? AMB POC URINALYSIS DIP STICK AUTO W/O MICRO       Jamella Grayer J Genevia Bouldin

## 2017-09-20 NOTE — Progress Notes (Signed)
Please let her know the urine culture is negative. Thank you!   KFA

## 2017-09-22 LAB — CULTURE, URINE

## 2017-09-25 NOTE — Telephone Encounter (Addendum)
Called pt and informed her of neg culture.    ----- Message from Kristin F [place], NP sent at 09/23/2017  9:22 AM EDT -----  Please let her know the urine culture is negative. Thank you!   KFA

## 2018-02-07 ENCOUNTER — Encounter: Attending: Urology

## 2018-02-10 ENCOUNTER — Encounter: Attending: Urology

## 2018-04-14 ENCOUNTER — Encounter: Attending: Urology

## 2018-04-25 ENCOUNTER — Ambulatory Visit: Admit: 2018-04-25 | Discharge: 2018-04-25 | Attending: Urology

## 2018-04-25 ENCOUNTER — Ambulatory Visit: Attending: Urology

## 2018-04-25 DIAGNOSIS — N390 Urinary tract infection, site not specified: Secondary | ICD-10-CM

## 2018-04-25 LAB — AMB POC URINALYSIS DIP STICK AUTO W/O MICRO
Bilirubin (UA POC): NEGATIVE
Bilirubin, Urine, POC: NEGATIVE
Blood (UA POC): NEGATIVE
Blood (UA POC): NEGATIVE
Glucose (UA POC): NEGATIVE
Glucose, Urine, POC: NEGATIVE
Ketones (UA POC): NEGATIVE
Ketones, Urine, POC: NEGATIVE
Leukocyte Esterase, Urine, POC: NEGATIVE
Leukocyte esterase (UA POC): NEGATIVE
Nitrite, Urine, POC: NEGATIVE
Nitrites (UA POC): NEGATIVE
Protein (UA POC): NEGATIVE
Protein, Urine, POC: NEGATIVE
Specific Gravity, Urine, POC: 1.015 NA (ref 1.001–1.035)
Specific gravity (UA POC): 1.015 (ref 1.001–1.035)
Urobilinogen (UA POC): 0.2 (ref 0.2–1)
Urobilinogen, POC: 0.2 (ref 0.2–1)
pH (UA POC): 7 (ref 4.6–8.0)
pH, Urine, POC: 7 NA (ref 4.6–8.0)

## 2018-04-25 MED ORDER — NITROFURANTOIN (25% MACROCRYSTAL FORM) 100 MG CAP
100 mg | ORAL_CAPSULE | Freq: Every evening | ORAL | 3 refills | Status: AC | PRN
Start: 2018-04-25 — End: ?

## 2018-04-25 NOTE — Progress Notes (Signed)
Progress Notes by Brantley Fling, MD at 04/25/18 1130                Author: Brantley Fling, MD  Service: --  Author Type: Physician       Filed: 04/25/18 1217  Encounter Date: 04/25/2018  Status: Signed          Editor: Brantley Fling, MD (Physician)                       ASSESSMENT:              ICD-10-CM  ICD-9-CM             1.  Recurrent UTI  N39.0  599.0              PLAN:     1. Recurrent UTI  - Proteus mirabilis UTI treated with Gent bladder instillations on 08/29/2017. No additional infections. WIll continue post coital prophylaxis      RTC 6 mos or sooner if neeed.       DISCUSSION:          Chief Complaint       Patient presents with        ?  Recurrent UTI             Patient is here for 31mth f/u patient states she has been doing well since [name]            History of Present Illness:  Michelle GRIMLEYis a 77y.o.  female who presents today in follow up for recurrent UTI's.   Patient had repeat Proteus mirabilis UTI on 08/14/2017 and subsequently began po Amoxicillin 500 mg BID x5 days. She was later treated for Proteus mirabilis [name] [place]  culture was unremarkable on 09/20/2017.   SHe has been doing well since her last visit.          PRIOR VISIT:   07/12/2017:   Recently had oral surgery and completed a course of Clindamycin. After completing the Clindamycin she reports she developed UTI symptoms, increased dysuria, frequency, urgency. Took  a course of Augmentin she had at home. A few days later developed severe diarrhea. Went to the ER and was found to have C. Difficile. Treated with vancomycin. Diarrhea later returned and was put back on Vancomycin by her PCP, has one week left. Diarrhea  has improved. Took Culturelle, but stopped Now on Saccharomyces boulardii + MOS with benefit. Leaving in late August for a cruise.      01/07/17   presents today in follow up for  recurrent UTI. Patient has done well over the interim, no recent infections and currently asymptomatic. Taking Macrobid post coitally without any issues. Coffee and carbonated drinks serve as a trigger for dysuria. Has  minor episode of right 'pinching' sensation, ?UTI however symptoms resolved shortly after a BM. States she is due to have a prolia injection today and believes this may be trigger for her infections. ?Microhematuria presence being a precursor for UTI's.   Denies flank pain, gross hematuria and dysuria. No f/c/n/v.       Has previously been treated with Cipro, Augmentin, Nitrofurantoin, Macrobid - admits UTI associated symptoms did not resolved with course of Cipro.       12/30/15: She denies any infection since we last met in May 2016. She continues on Macrobid 100  mg post coitus.  She had a negative hematuria  work up in 2015.  No issues at this time. She denies any urinary frequency, urgency, incomplete bladder emptying.  She is asymptomatic for UTI today. No dysuria, gross hematuria. No f/c/n/v.       5//20/16: She presents today for follow up and management of recurrent infections.    Patient states that she recently finished Cipro and has not experiencing and symptoms associated with an infection. No current symptoms. Denies any dysuria, hematuria, or voiding  symptoms.    No f/n/c/v.       01/2014 who presents today with complaint of dysuria, frequency, urgency cloudy urine, and pelvic pressure that resolved 1 week ago; these  have resolved now. In October, Dr. Georgann Housekeeper performed UA, provided patient Macrobid 100 mg to use post-coitally. She had it on hand while on a cruise in December; used Macrobid 12/01/14 - 12/08/13. She continued with symptoms until 01/16/14. She  uses macrobid postcoitally.       01/21/14 Prior history obtained on 01/21/14: for reevaluation. She reports that she felt she had a UTI just prior to Christmas. UA and culture  were negative. She reports the passage of small  amts of sand. Reports RLQ pain which increases with caffeine. Reports it is an ache. Locates it to right suprapubic pain.  Occ vaginal spotting and using vaginal estrogen. Denies SUI.           REVIEW OF SYSTEMS:       Constitutional: Fever: No   Skin: Rash: No   HEENT: Hearing difficulty: No   Eyes: Blurred vision: No   Cardiovascular: Chest pain: No   Respiratory: Shortness of breath: No   Gastrointestinal: Nausea/vomiting: No   Musculoskeletal: Back pain: No   Neurological: Weakness: No   Psychological: Memory loss: No   Comments/additional findings:            Past Medical History:        Diagnosis  Date         ?  Cystocele       ?  Cystocele, unspecified       ?  High cholesterol       ?  Recurrent UTI       ?  Urinary frequency       ?  Urinary incontinence           ?  UTI (urinary tract infection)               Past Surgical History:         Procedure  Laterality  Date          ?  HX KNEE REPLACEMENT    2/20110          partial           ?  HX OTHER SURGICAL    2005          PLASTIC SURGERY          ?  HX OTHER SURGICAL    1971          FINE NEEDLE ASPIRATE, CYTOLOGY          ?  HX OTHER SURGICAL    12/20/2008          CYSTOCELE REPAIR SLING POST PROCEDURE ORDERSET 517          ?  HX POLYPECTOMY    2007     ?  HX UROLOGICAL  01/2013          bladder sling             Family History         Problem  Relation  Age of Onset          ?  Heart Disease  Mother            ?  Heart Disease  Father               Social History          Tobacco Use         ?  Smoking status:  Former Smoker     ?  Smokeless tobacco:  Never Used       Substance Use Topics         ?  Alcohol use:  Yes         ?  Drug use:  No             Allergies        Allergen  Reactions         ?  Alendronate  Other (comments), Itching and Shortness of Breath     ?  Sulfa (Sulfonamide Antibiotics)  Other (comments)             50 yrs ago         ?  Sulfasalazine  Other (comments)             Other reaction(s): Dermatological problems, e.g., rash,  hives             Current Outpatient Medications          Medication  Sig  Dispense  Refill           ?  therapeutic multivitamin-minerals (VITAMINS AND MINERALS) tablet  Take 1 Tab by mouth.         ?  conjugated estrogens (PREMARIN) 0.625 mg tablet  Take  by mouth.         ?  Azelastine (ASTEPRO) 0.15 % (205.5 mcg) nasal spray  two (2) times a day.         ?  denosumab (PROLIA) 60 mg/mL injection  60 mg by SubCUTAneous route.         ?  levothyroxine (SYNTHROID) 175 mcg tablet  0.075 mg.         ?  atorvastatin (LIPITOR) 10 mg tablet           ?  simvastatin (ZOCOR) 20 mg tablet  Take  by mouth nightly.         ?  aspirin delayed-release 81 mg tablet  Take  by mouth daily.         ?  raloxifene (EVISTA) 60 mg tablet  Take  by mouth daily.         ?  cholecalciferol, vitamin D3, (VITAMIN D3) 2,000 unit tab  Take  by mouth.         ?  multivitamin (ONE A DAY) tablet  Take 1 Tab by mouth daily.         ?  naproxen sodium (ALEVE) 220 mg tablet  Take 220 mg by mouth two (2) times daily (with meals).         ?  gentamicin (GARAMYCIN) 40 mg/mL injection  Instill in Bladder  1 Vial  0     ?  gentamicin (GARAMYCIN) 40 mg/mL injection  Instill in Bladder  2 Vial  0     ?  gentamicin (GARAMYCIN) 40 mg/mL injection  Instill in Bladder  2 Vial  0           ?  nitrofurantoin, macrocrystal-monohydrate, (MACROBID) 100 mg capsule  Take 1 Cap by mouth nightly as needed.  30 Cap  3             OB History                Gravida      3                Para      2                Term      2                Preterm      0                AB      1                Living      0                             SAB      1                TAB      0                Ectopic      0                Molar                       Multiple      0                Live Births                                        PHYSICAL EXAMINATION:       Visit Vitals      BP  138/74     Ht  '5\' 4"'$  (1.626 m)     Wt  134 lb (60.8 kg)        BMI  23.00 kg/m??         Constitutional: Well developed, well-nourished  female in no acute distress.    CV:  No peripheral swelling noted   Respiratory: No respiratory distress or difficulties   Abdomen:  Soft and nontender. No masses.     GU Female:  deferred   Skin: No bruising or rashes.  No petechia.     Neuro/Psych:  Patient with appropriate affect.  Alert and oriented.     Lymphatic:   No enlargement of inguinal lymph nodes.      REVIEW OF LABS AND IMAGING:          Results for orders placed or performed in visit on 04/25/18     AMB POC URINALYSIS DIP STICK AUTO W/O MICRO         Result  Value  Ref Range            Color (UA POC)  Yellow         Clarity (UA POC)  Clear         Glucose (UA POC)  Negative  Negative       Bilirubin (UA POC)  Negative  Negative       Ketones (UA POC)  Negative  Negative       Specific gravity (UA POC)  1.015  1.001 - 1.035       Blood (UA POC)  Negative  Negative       pH (UA POC)  7.0  4.6 - 8.0       Protein (UA POC)  Negative  Negative       Urobilinogen (UA POC)  0.2 mg/dL  0.2 - 1       Nitrites (UA POC)  Negative  Negative            Leukocyte esterase (UA POC)  Negative  Negative        Prior labs or imaging:   Urine Culture 09/20/17   Mixed urogenital flora   Less than 10,000 colonies/mL       Urine culture 08/09/2017:   Proteus mirabilis, greater than 100,000 cfu/mL   AMIKACIN ?? ?? ?? ?? ?? ?? ?? ?? ?? ?? ?? ?? ?? ?? ?? ?? ?? ?? ?? ?? ?? ?? ?? S   AMPICILLIN ?? ?? ?? ?? ?? ?? ??  ?? ?? ?? ?? ?? ?? ?? ?? ?? ?? ?? ?? ?? ?? ?? S   AUGMENTIN ?? ?? ?? ?? ?? ?? ?? ?? ?? ?? ?? ?? ?? ?? ?? ?? ?? ?? ?? ?? ?? ?? ??S   BACTRIM (SULFA/TRIMETHPRM) ?? ??  ?? ?? ?? ?? ?? ?? ?? ?? ?? ?? ?? ?? S   CEFAZOLIN (ANCEF) ?? ?? ?? ?? ?? ?? ?? ?? ?? ?? ?? ?? ?? ?? ?? ?? ?? ?? ??R   CEFTAZIDIME ?? ?? ?? ?? ?? ?? ?? ?? ?? ?? ??  ?? ?? ?? ?? ?? ?? ?? ?? ?? ?? ??S   CIPRO ?? ?? ?? ?? ?? ?? ?? ?? ?? ?? ?? ?? ?? ?? ?? ?? ?? ?? ?? ?? ?? ?? ?? ?? ??S   GENTAMICIN ?? ?? ?? ?? ?? ?? ?? ??  ?? ?? ?? ?? ?? ?? ?? ?? ?? ?? ?? ?? ?? ?? S   LEVAQUIN ?? ?? ?? ?? ?? ?? ?? ?? ?? ?? ?? ?? ?? ?? ?? ?? ?? ?? ?? ?? ?? ?? ?? S   MACRODANTN (NITROFURANTOIN ?? ?? ??  ?? ?? ?? ?? ?? ?? ?? ?? ?? ?? ?? R   ROCEPHIN (CEFTRIAXONE) ?? ?? ?? ?? ?? ?? ?? ?? ?? ?? ?? ??  ?? ?? ?? ?? S   TETRACYCLINE ?? ?? ?? ?? ?? ?? ?? ?? ?? ?? ?? ?? ??  ?? ?? ?? ?? ?? ?? ?? ?? R   TOBRAMYCIN ?? ?? ?? ?? ?? ?? ?? ?? ?? ?? ?? ?? ?? ?? ?? ?? ?? ?? ?? ?? ?? ?? S   ZOSYN (TAZOBACTAM/PIP) ?? ?? ?? ?? ?? ?? ?? ?? ?? ?? ??  ?? ?? ?? ?? ?? S       Urine culture 08/14/17:   Proteus mirabilis   Greater than 100,000 colony forming units per mL        Ampicillin ($)  Susceptible  S     Cefazolin ($)  Susceptible  S     Cefepime ($$)  Susceptible  S     Ceftriaxone ($)  Susceptible  S     Cefuroxime ($)  Susceptible  S     Ciprofloxacin ($)  Susceptible  S     Ertapenem ($$$$)  Susceptible  S     Gentamicin ($)  Susceptible  S     Levofloxacin ($)  Susceptible  S     Meropenem ($$)  Resistant  R     Nitrofurantoin  Resistant  R     Piperacillin/Tazobac ($)  Susceptible  S     Tetracycline  Resistant  R     Tobramycin ($)  Susceptible  S     Trimeth-Sulfamethoxa  Susceptible  S              Urine culture 07/19/17: (not treated due to low count and asymptomatic)   COLONY COUNT / RESULT:   20,000 cfu/ml     RESULT/ORG ID:   Proteus mirabilis (Isolate 1)     Sensitivity Analysis ?? ?? ?? ?? ?? ?? ?? ??  ?? ?? ?? ?? ?? Isolate 1   -------------------- ?? ?? ?? ?? ?? ?? ?? ?? ?? ?? ?? ?? ?? ---------     AMIKACIN ?? ?? ?? ?? ?? ?? ?? ?? ?? ?? ?? ?? ?? ?? ?? ?? ?? ?? ??  ?? ?? ?? ?? S   AMPICILLIN ?? ?? ?? ?? ?? ?? ?? ?? ?? ?? ?? ?? ?? ?? ?? ?? ?? ?? ?? ?? ?? ?? S   AUGMENTIN ?? ?? ?? ?? ?? ?? ?? ?? ?? ?? ?? ?? ?? ?? ?? ?? ??  ?? ?? ?? ?? ?? ??S   BACTRIM (SULFA/TRIMETHPRM) ?? ?? ?? ?? ?? ?? ?? ?? ?? ?? ?? ?? ?? ?? S   CEFAZOLIN (ANCEF) ?? ?? ?? ?? ?? ?? ?? ?? ?? ?? ?? ?? ?? ?? ?? ?? ?? ?? ??R    CEFTAZIDIME ?? ?? ?? ?? ?? ?? ?? ?? ?? ?? ?? ?? ?? ?? ?? ?? ?? ?? ?? ?? ?? ??S   CIPRO ?? ?? ?? ?? ?? ?? ?? ?? ?? ?? ?? ?? ?? ?? ?? ?? ?? ?? ?? ?? ?? ?? ?? ??  ??S   GENTAMICIN ?? ?? ?? ?? ?? ?? ?? ?? ?? ?? ?? ?? ?? ?? ?? ?? ?? ?? ?? ?? ?? ?? S   LEVAQUIN ?? ?? ?? ?? ?? ?? ?? ?? ?? ?? ?? ?? ?? ?? ?? ?? ?? ?? ?? ?? ??  ?? ?? S   MACRODANTN (NITROFURANTOIN ?? ?? ?? ?? ?? ?? ?? ?? ?? ?? ?? ?? ?? ?? R   ROCEPHIN (CEFTRIAXONE) ?? ?? ?? ?? ?? ?? ?? ?? ?? ?? ?? ?? ?? ?? ?? ?? S   TETRACYCLINE ??  ?? ?? ?? ?? ?? ?? ?? ?? ?? ?? ?? ?? ?? ?? ?? ?? ?? ?? ?? ?? R   TOBRAMYCIN ?? ?? ?? ?? ?? ?? ?????? ?? ?? ?? ?? ?? ?? ?? ?? ?? ?? ?? ?? ?? ?? S   ZOSYN (TAZOBACTAM/PIP)  ?? ?? ?? ?? ?? ?? ?? ?? ?? ??  ?? ?? ?? ?? ?? ?? S       Urine culture 06/03/2017 - Mixed Culture          A copy of today's office visit with all pertinent imaging results and labs were sent to the referring physician.        Michaell Cowing, M.D.,   [facility]   a division of [facility]      [place], [place]   [phone] ext [phone]   [phone] fax      Medical documentation is provided with the assistance of Suann Larry. Fletcher Anon, medical scribe for Brantley Fling, MD on  04/25/2018

## 2018-04-25 NOTE — Progress Notes (Signed)
ASSESSMENT:     ICD-10-CM ICD-9-CM    1. Recurrent UTI N39.0 599.0         PLAN:    1. Recurrent UTI  - Proteus mirabilis UTI treated with Gent bladder instillations on 08/29/2017. No additional infections. WIll continue post coital prophylaxis    RTC 6 mos or sooner if neeed.     DISCUSSION:      Chief Complaint   Patient presents with   ??? Recurrent UTI     Patient is here for 16mth f/u patient states she has been doing well since [name]        History of Present Illness:  Michelle WHITMOYERis a 77y.o. female who presents today in follow up for recurrent UTI's.  Patient had repeat Proteus mirabilis UTI on 08/14/2017 and subsequently began po Amoxicillin 500 mg BID x5 days. She was later treated for Proteus mirabilis [facility] [place] culture was unremarkable on 09/20/2017.  SHe has been doing well since her last visit.       PRIOR VISIT:  07/12/2017:  Recently had oral surgery and completed a course of Clindamycin. After completing the Clindamycin she reports she developed UTI symptoms, increased dysuria, frequency, urgency. Took a course of Augmentin she had at home. A few days later developed severe diarrhea. Went to the ER and was found to have C. Difficile. Treated with vancomycin. Diarrhea later returned and was put back on Vancomycin by her PCP, has one week left. Diarrhea has improved. Took Culturelle, but stopped Now on Saccharomyces boulardii + MOS with benefit. Leaving in late August for a cruise.    01/07/17  presents today in follow up for recurrent UTI. Patient has done well over the interim, no recent infections and currently asymptomatic. Taking Macrobid post coitally without any issues. Coffee and carbonated drinks serve as a trigger for dysuria. Has minor episode of right 'pinching' sensation, ?UTI however symptoms resolved shortly after a BM. States she  is due to have a prolia injection today and believes this may be trigger for her infections. ?Microhematuria presence being a precursor for UTI's.  Denies flank pain, gross hematuria and dysuria. No f/c/n/v.     Has previously been treated with Cipro, Augmentin, Nitrofurantoin, Macrobid - admits UTI associated symptoms did not resolved with course of Cipro.     12/30/15: She denies any infection since we last met in May 2016. She continues on Macrobid 100 mg post coitus.  She had a negative hematuria work up in 2015.  No issues at this time. She denies any urinary frequency, urgency, incomplete bladder emptying.  She is asymptomatic for UTI today. No dysuria, gross hematuria. No f/c/n/v.     5//20/16: She presents today for follow up and management of recurrent infections.   Patient states that she recently finished Cipro and has not experiencing and symptoms associated with an infection. No current symptoms. Denies any dysuria, hematuria, or voiding symptoms.   No f/n/c/v.     01/2014 who presents today with complaint of dysuria, frequency, urgency cloudy urine, and pelvic pressure that resolved 1 week ago; these have resolved now. In October, Dr. SGeorgann Housekeeperperformed UA, provided patient Macrobid 100 mg to use post-coitally. She had it on hand while on a cruise in December; used Macrobid 12/01/14 - 12/08/13. She continued with symptoms until 01/16/14. She uses macrobid postcoitally.     01/21/14 Prior history obtained on  01/21/14: for reevaluation. She reports that she felt she had a UTI just prior to Christmas. UA and culture were negative. She reports the passage of small amts of sand. Reports RLQ pain which increases with caffeine. Reports it is an ache. Locates it to right suprapubic pain.  Occ vaginal spotting and using vaginal estrogen. Denies SUI.        REVIEW OF SYSTEMS:     Constitutional: Fever: No  Skin: Rash: No  HEENT: Hearing difficulty: No  Eyes: Blurred vision: No  Cardiovascular: Chest pain: No   Respiratory: Shortness of breath: No  Gastrointestinal: Nausea/vomiting: No  Musculoskeletal: Back pain: No  Neurological: Weakness: No  Psychological: Memory loss: No  Comments/additional findings:       Past Medical History:   Diagnosis Date   ??? Cystocele    ??? Cystocele, unspecified    ??? High cholesterol    ??? Recurrent UTI    ??? Urinary frequency    ??? Urinary incontinence    ??? UTI (urinary tract infection)        Past Surgical History:   Procedure Laterality Date   ??? HX KNEE REPLACEMENT  2/20110    partial    ??? HX OTHER SURGICAL  2005    PLASTIC SURGERY   ??? HX OTHER SURGICAL  1971    FINE NEEDLE ASPIRATE, CYTOLOGY   ??? HX OTHER SURGICAL  12/20/2008    CYSTOCELE REPAIR SLING POST PROCEDURE ORDERSET 517   ??? HX POLYPECTOMY  2007   ??? HX UROLOGICAL  01/2013    bladder sling       Family History   Problem Relation Age of Onset   ??? Heart Disease Mother    ??? Heart Disease Father        Social History     Tobacco Use   ??? Smoking status: Former Smoker   ??? Smokeless tobacco: Never Used   Substance Use Topics   ??? Alcohol use: Yes   ??? Drug use: No       Allergies   Allergen Reactions   ??? Alendronate Other (comments), Itching and Shortness of Breath   ??? Sulfa (Sulfonamide Antibiotics) Other (comments)     50 yrs ago   ??? Sulfasalazine Other (comments)     Other reaction(s): Dermatological problems, e.g., rash, hives       Current Outpatient Medications   Medication Sig Dispense Refill   ??? therapeutic multivitamin-minerals (VITAMINS AND MINERALS) tablet Take 1 Tab by mouth.     ??? conjugated estrogens (PREMARIN) 0.625 mg tablet Take  by mouth.     ??? Azelastine (ASTEPRO) 0.15 % (205.5 mcg) nasal spray two (2) times a day.     ??? denosumab (PROLIA) 60 mg/mL injection 60 mg by SubCUTAneous route.     ??? levothyroxine (SYNTHROID) 175 mcg tablet 0.075 mg.     ??? atorvastatin (LIPITOR) 10 mg tablet      ??? simvastatin (ZOCOR) 20 mg tablet Take  by mouth nightly.     ??? aspirin delayed-release 81 mg tablet Take  by mouth daily.      ??? raloxifene (EVISTA) 60 mg tablet Take  by mouth daily.     ??? cholecalciferol, vitamin D3, (VITAMIN D3) 2,000 unit tab Take  by mouth.     ??? multivitamin (ONE A DAY) tablet Take 1 Tab by mouth daily.     ??? naproxen sodium (ALEVE) 220 mg tablet Take 220 mg by mouth two (2) times daily (with meals).     ???  gentamicin (GARAMYCIN) 40 mg/mL injection Instill in Bladder 1 Vial 0   ??? gentamicin (GARAMYCIN) 40 mg/mL injection Instill in Bladder 2 Vial 0   ??? gentamicin (GARAMYCIN) 40 mg/mL injection Instill in Bladder 2 Vial 0   ??? nitrofurantoin, macrocrystal-monohydrate, (MACROBID) 100 mg capsule Take 1 Cap by mouth nightly as needed. 30 Cap 3       OB History     Gravida   3    Para   2    Term   2    Preterm   0    AB   1    Living   0       SAB   1    TAB   0    Ectopic   0    Molar        Multiple   0    Live Births                  PHYSICAL EXAMINATION:     Visit Vitals  BP 138/74   Ht '5\' 4"'  (1.626 m)   Wt 134 lb (60.8 kg)   BMI 23.00 kg/m??     Constitutional: Well developed, well-nourished female in no acute distress.   CV:  No peripheral swelling noted  Respiratory: No respiratory distress or difficulties  Abdomen:  Soft and nontender. No masses.    GU Female:  deferred  Skin: No bruising or rashes.  No petechia.    Neuro/Psych:  Patient with appropriate affect.  Alert and oriented.    Lymphatic:   No enlargement of inguinal lymph nodes.    REVIEW OF LABS AND IMAGING:      Results for orders placed or performed in visit on 04/25/18   AMB POC URINALYSIS DIP STICK AUTO W/O MICRO   Result Value Ref Range    Color (UA POC) Yellow     Clarity (UA POC) Clear     Glucose (UA POC) Negative Negative    Bilirubin (UA POC) Negative Negative    Ketones (UA POC) Negative Negative    Specific gravity (UA POC) 1.015 1.001 - 1.035    Blood (UA POC) Negative Negative    pH (UA POC) 7.0 4.6 - 8.0    Protein (UA POC) Negative Negative    Urobilinogen (UA POC) 0.2 mg/dL 0.2 - 1    Nitrites (UA POC) Negative Negative     Leukocyte esterase (UA POC) Negative Negative     Prior labs or imaging:  Urine Culture 09/20/17  Mixed urogenital flora   Less than 10,000 colonies/mL     Urine culture 08/09/2017:  Proteus mirabilis, greater than 100,000 cfu/mL  AMIKACIN ?? ?? ?? ?? ?? ?? ?? ?? ?? ?? ?? ?? ?? ?? ?? ?? ?? ?? ?? ?? ?? ?? ?? S   AMPICILLIN ?? ?? ?? ?? ?? ?? ?? ?? ?? ?? ?? ?? ?? ?? ?? ?? ?? ?? ?? ?? ?? ?? S   AUGMENTIN ?? ?? ?? ?? ?? ?? ?? ?? ?? ?? ?? ?? ?? ?? ?? ?? ?? ?? ?? ?? ?? ?? ??S   BACTRIM (SULFA/TRIMETHPRM) ?? ?? ?? ?? ?? ?? ?? ?? ?? ?? ?? ?? ?? ?? S   CEFAZOLIN (ANCEF) ?? ?? ?? ?? ?? ?? ?? ?? ?? ?? ?? ?? ?? ?? ?? ?? ?? ?? ??R   CEFTAZIDIME ?? ?? ?? ?? ?? ?? ?? ?? ?? ?? ?? ?? ?? ?? ?? ?? ?? ?? ?? ?? ?? ??  S   CIPRO ?? ?? ?? ?? ?? ?? ?? ?? ?? ?? ?? ?? ?? ?? ?? ?? ?? ?? ?? ?? ?? ?? ?? ?? ??S   GENTAMICIN ?? ?? ?? ?? ?? ?? ?? ?? ?? ?? ?? ?? ?? ?? ?? ?? ?? ?? ?? ?? ?? ?? S   LEVAQUIN ?? ?? ?? ?? ?? ?? ?? ?? ?? ?? ?? ?? ?? ?? ?? ?? ?? ?? ?? ?? ?? ?? ?? S   MACRODANTN (NITROFURANTOIN ?? ?? ?? ?? ?? ?? ?? ?? ?? ?? ?? ?? ?? ?? R   ROCEPHIN (CEFTRIAXONE) ?? ?? ?? ?? ?? ?? ?? ?? ?? ?? ?? ?? ?? ?? ?? ?? S   TETRACYCLINE ?? ?? ?? ?? ?? ?? ?? ?? ?? ?? ?? ?? ?? ?? ?? ?? ?? ?? ?? ?? ?? R   TOBRAMYCIN ?? ?? ?? ?? ?? ?? ?? ?? ?? ?? ?? ?? ?? ?? ?? ?? ?? ?? ?? ?? ?? ?? S   ZOSYN (TAZOBACTAM/PIP) ?? ?? ?? ?? ?? ?? ?? ?? ?? ?? ?? ?? ?? ?? ?? ?? S     Urine culture 08/14/17:  Proteus mirabilis   Greater than 100,000 colony forming units per mL   Ampicillin ($) Susceptible S   Cefazolin ($) Susceptible S   Cefepime ($$) Susceptible S   Ceftriaxone ($) Susceptible S   Cefuroxime ($) Susceptible S   Ciprofloxacin ($) Susceptible S   Ertapenem ($$$$) Susceptible S   Gentamicin ($) Susceptible S   Levofloxacin ($) Susceptible S   Meropenem ($$) Resistant R   Nitrofurantoin Resistant R   Piperacillin/Tazobac ($) Susceptible S   Tetracycline Resistant R   Tobramycin ($) Susceptible S   Trimeth-Sulfamethoxa Susceptible S         Urine culture 07/19/17: (not treated due to low count and asymptomatic)  COLONY COUNT / RESULT:   20,000 cfu/ml     RESULT/ORG ID:   Proteus mirabilis (Isolate 1)     Sensitivity Analysis ?? ?? ?? ?? ?? ?? ?? ?? ?? ?? ?? ?? ?? Isolate 1   -------------------- ?? ?? ?? ?? ?? ?? ?? ?? ?? ?? ?? ?? ?? ---------      AMIKACIN ?? ?? ?? ?? ?? ?? ?? ?? ?? ?? ?? ?? ?? ?? ?? ?? ?? ?? ?? ?? ?? ?? ?? S   AMPICILLIN ?? ?? ?? ?? ?? ?? ?? ?? ?? ?? ?? ?? ?? ?? ?? ?? ?? ?? ?? ?? ?? ?? S   AUGMENTIN ?? ?? ?? ?? ?? ?? ?? ?? ?? ?? ?? ?? ?? ?? ?? ?? ?? ?? ?? ?? ?? ?? ??S   BACTRIM (SULFA/TRIMETHPRM) ?? ?? ?? ?? ?? ?? ?? ?? ?? ?? ?? ?? ?? ?? S   CEFAZOLIN (ANCEF) ?? ?? ?? ?? ?? ?? ?? ?? ?? ?? ?? ?? ?? ?? ?? ?? ?? ?? ??R   CEFTAZIDIME ?? ?? ?? ?? ?? ?? ?? ?? ?? ?? ?? ?? ?? ?? ?? ?? ?? ?? ?? ?? ?? ??S   CIPRO ?? ?? ?? ?? ?? ?? ?? ?? ?? ?? ?? ?? ?? ?? ?? ?? ?? ?? ?? ?? ?? ?? ?? ?? ??S   GENTAMICIN ?? ?? ?? ?? ?? ?? ?? ?? ?? ?? ?? ?? ?? ?? ?? ?? ?? ?? ?? ?? ?? ?? S   LEVAQUIN ?? ?? ?? ?? ?? ?? ?? ?? ?? ?? ?? ?? ?? ?? ?? ?? ?? ?? ?? ?? ?? ?? ??  S   MACRODANTN (NITROFURANTOIN ?? ?? ?? ?? ?? ?? ?? ?? ?? ?? ?? ?? ?? ?? R   ROCEPHIN (CEFTRIAXONE) ?? ?? ?? ?? ?? ?? ?? ?? ?? ?? ?? ?? ?? ?? ?? ?? S   TETRACYCLINE ?? ?? ?? ?? ?? ?? ?? ?? ?? ?? ?? ?? ?? ?? ?? ?? ?? ?? ?? ?? ?? R   TOBRAMYCIN ?? ?? ?? ?? ?? ?? ?????? ?? ?? ?? ?? ?? ?? ?? ?? ?? ?? ?? ?? ?? ?? S   ZOSYN (TAZOBACTAM/PIP) ?? ?? ?? ?? ?? ?? ?? ?? ?? ?? ?? ?? ?? ?? ?? ?? S     Urine culture 06/03/2017 - Mixed Culture       A copy of today's office visit with all pertinent imaging results and labs were sent to the referring physician.      Michaell Cowing, M.D.,   [facility]  a division of [facility]    [place], [place]  [phone] ext [place] fax    Medical documentation is provided with the assistance of Suann Larry. Fletcher Anon, medical scribe for Brantley Fling, MD on 04/25/2018

## 2018-11-10 ENCOUNTER — Ambulatory Visit: Admit: 2018-11-10 | Discharge: 2018-11-10 | Attending: Urology

## 2018-11-10 ENCOUNTER — Ambulatory Visit: Attending: Urology

## 2018-11-10 DIAGNOSIS — N390 Urinary tract infection, site not specified: Secondary | ICD-10-CM

## 2018-11-10 LAB — AMB POC URINALYSIS DIP STICK AUTO W/O MICRO
Bilirubin (UA POC): NEGATIVE
Bilirubin, Urine, POC: NEGATIVE
Blood (UA POC): NEGATIVE
Blood (UA POC): NEGATIVE
Glucose (UA POC): NEGATIVE
Glucose, Urine, POC: NEGATIVE
Ketones (UA POC): NEGATIVE
Ketones, Urine, POC: NEGATIVE
Leukocyte Esterase, Urine, POC: NEGATIVE
Leukocyte esterase (UA POC): NEGATIVE
Nitrite, Urine, POC: NEGATIVE
Nitrites (UA POC): NEGATIVE
Protein (UA POC): NEGATIVE
Protein, Urine, POC: NEGATIVE
Specific Gravity, Urine, POC: 1.02 NA (ref 1.001–1.035)
Specific gravity (UA POC): 1.02 (ref 1.001–1.035)
Urobilinogen (UA POC): 0.2 (ref 0.2–1)
Urobilinogen, POC: 0.2 (ref 0.2–1)
pH (UA POC): 6 (ref 4.6–8.0)
pH, Urine, POC: 6 NA (ref 4.6–8.0)

## 2018-11-10 LAB — AMB POC PVR, MEAS,POST-VOID RES,US,NON-IMAGING
PVR POC: 0 cc
PVR: 0 cc

## 2018-11-10 NOTE — Progress Notes (Signed)
Progress Notes by Brantley Fling, MD at 11/10/18 1515                Author: Brantley Fling, MD  Service: --  Author Type: Physician       Filed: 11/24/18 1644  Encounter Date: 11/10/2018  Status: Signed          Editor: Brantley Fling, MD (Physician)                       ASSESSMENT:              ICD-10-CM  ICD-9-CM             1.  Recurrent UTI  N39.0  599.0  AMB POC URINALYSIS DIP STICK AUTO W/O MICRO                AMB POC PVR, MEAS,POST-VOID RES,US,NON-IMAGING           2.  Urinary frequency  R35.0  788.41  AMB POC URINALYSIS DIP STICK AUTO W/O MICRO                AMB POC PVR, MEAS,POST-VOID RES,US,NON-IMAGING           3.  Acute cystitis without hematuria  N30.00  595.0  AMB POC URINALYSIS DIP STICK AUTO W/O MICRO                AMB POC PVR, MEAS,POST-VOID RES,US,NON-IMAGING            PLAN:     1. Recurrent UTI  - Proteus mirabilis UTI treated with [facility] bladder instillations on 08/29/2017. No additional infections.     Continue post coital prophylaxis Nitrofurantoin.      UA today negative for infection.    PVR today 0cc.       RTC in 1 year for UTI f/u; sooner if neeed.    She can cancel the appointment if she has no issues in the interim.       DISCUSSION:          Chief Complaint       Patient presents with        ?  Recurrent UTI             Patient states that she has no complaints           History of Present Illness:  Michelle Snyder is a 77 y.o.  female who presents today in follow up for recurrent UTI's.  She has been doing very well in the interim and has no complaints today.  No breakthrough  infections in the interim.  She continued on post coital nitrofurantoin although she admits to decreased frequency as her husband has been sick.  No dysuria, gross hematuria, f/c/n/v.             PRIOR VISIT:   04/25/2018: Patient had repeat Proteus mirabilis UTI on 08/14/2017 and subsequently began po Amoxicillin 500 mg BID x5 days.  She was later treated for Proteus  mirabilis UTI on 08/29/2017 with [facility] bladder instillations 160 mg daily x5 days in [place]. Repeat urine culture was unremarkable on 09/20/2017.   SHe has been doing well since her last visit.          07/12/2017:   Recently had oral surgery and completed a course of Clindamycin. After completing the Clindamycin she reports she developed UTI symptoms, increased dysuria, frequency, urgency. Took  a course of Augmentin she  had at home. A few days later developed severe diarrhea. Went to the ER and was found to have C. Difficile. Treated with vancomycin. Diarrhea later returned and was put back on Vancomycin by her PCP, has one week left. Diarrhea  has improved. Took Culturelle, but stopped Now on Saccharomyces boulardii + MOS with benefit. Leaving in late August for a cruise.      01/07/17   presents today in follow up for recurrent UTI. Patient has done well over the interim, no recent infections and currently asymptomatic. Taking Macrobid post coitally without any issues. Coffee and carbonated drinks serve as a trigger for dysuria. Has  minor episode of right 'pinching' sensation, ?UTI however symptoms resolved shortly after a BM. States she is due to have a prolia injection today and believes this may be trigger for her infections. ?Microhematuria presence being a precursor for UTI's.   Denies flank pain, gross hematuria and dysuria. No f/c/n/v.       Has previously been treated with Cipro, Augmentin, Nitrofurantoin, Macrobid - admits UTI associated symptoms did not resolved with course of Cipro.       12/30/15: She denies any infection since we last met in May 2016. She continues on Macrobid 100 mg post coitus.  She had a negative hematuria  work up in 2015.  No issues at this time. She denies any urinary frequency, urgency, incomplete bladder emptying.  She is asymptomatic for UTI today. No dysuria, gross hematuria. No f/c/n/v.       5//20/16: She presents today for follow up and management of recurrent  infections.    Patient states that she recently finished Cipro and has not experiencing and symptoms associated with an infection. No current symptoms. Denies any dysuria, hematuria, or voiding  symptoms.    No f/n/c/v.       01/2014 who presents today with complaint of dysuria, frequency, urgency cloudy urine, and pelvic pressure that resolved 1 week ago; these  have resolved now. In October, Dr. Georgann Housekeeper performed UA, provided patient Macrobid 100 mg to use post-coitally. She had it on hand while on a cruise in December; used Macrobid 12/01/14 - 12/08/13. She continued with symptoms until 01/16/14. She  uses macrobid postcoitally.       01/21/14 Prior history obtained on 01/21/14: for reevaluation. She reports that she felt she had a UTI just prior to Christmas. UA and culture  were negative. She reports the passage of small amts of sand. Reports RLQ pain which increases with caffeine. Reports it is an ache. Locates it to right suprapubic pain.  Occ vaginal spotting and using vaginal estrogen. Denies SUI.           REVIEW OF SYSTEMS:       Constitutional: Fever: No   Skin: Rash: No   HEENT: Hearing difficulty: No   Eyes: Blurred vision: No   Cardiovascular: Chest pain: No   Respiratory: Shortness of breath: No   Gastrointestinal: Nausea/vomiting: No   Musculoskeletal: Back pain: No   Neurological: Weakness: No   Psychological: Memory loss: No   Comments/additional findings:            Past Medical History:        Diagnosis  Date         ?  Cystocele       ?  Cystocele, unspecified       ?  High cholesterol       ?  Recurrent UTI       ?  Urinary frequency       ?  Urinary incontinence           ?  UTI (urinary tract infection)               Past Surgical History:         Procedure  Laterality  Date          ?  HX KNEE REPLACEMENT    2/20110          partial           ?  HX OTHER SURGICAL    2005          PLASTIC SURGERY          ?  HX OTHER SURGICAL    1971          FINE NEEDLE ASPIRATE, CYTOLOGY          ?  HX  OTHER SURGICAL    12/20/2008          CYSTOCELE REPAIR SLING POST PROCEDURE ORDERSET 517          ?  HX POLYPECTOMY    2007     ?  HX UROLOGICAL    01/2013          bladder sling             Family History         Problem  Relation  Age of Onset          ?  Heart Disease  Mother            ?  Heart Disease  Father               Social History          Tobacco Use         ?  Smoking status:  Former Smoker     ?  Smokeless tobacco:  Never Used       Substance Use Topics         ?  Alcohol use:  Yes         ?  Drug use:  No             Allergies        Allergen  Reactions         ?  Alendronate  Other (comments), Itching and Shortness of Breath     ?  Clindamycin  Other (comments)     ?  Sulfa (Sulfonamide Antibiotics)  Other (comments)             50 yrs ago         ?  Sulfasalazine  Other (comments)             Other reaction(s): Dermatological problems, e.g., rash, hives             Current Outpatient Medications          Medication  Sig  Dispense  Refill           ?  nitrofurantoin, macrocrystal-monohydrate, (MACROBID) 100 mg capsule  Take 1 Cap by mouth nightly as needed (coitus).  30 Cap  3           ?  therapeutic multivitamin-minerals (VITAMINS AND MINERALS) tablet  Take 1 Tab by mouth.         ?  denosumab (PROLIA) 60 mg/mL injection  60 mg by SubCUTAneous route.         ?  levothyroxine (SYNTHROID) 175 mcg tablet  0.075 mg.         ?  atorvastatin (LIPITOR) 10 mg tablet           ?  aspirin delayed-release 81 mg tablet  Take  by mouth daily.         ?  raloxifene (EVISTA) 60 mg tablet  Take  by mouth daily.         ?  cholecalciferol, vitamin D3, (VITAMIN D3) 2,000 unit tab  Take  by mouth.         ?  multivitamin (ONE A DAY) tablet  Take 1 Tab by mouth daily.               ?  naproxen sodium (ALEVE) 220 mg tablet  Take 220 mg by mouth two (2) times daily (with meals).                 OB History                Gravida      3                Para      2                Term      2                Preterm      0                 AB      1                Living      0                             SAB      1                TAB      0                Ectopic      0                Molar                       Multiple      0                Live Births                                        PHYSICAL EXAMINATION:       Visit Vitals      BP  128/90     Ht  '5\' 4"'$  (1.626 m)     Wt  140 lb (63.5 kg)        BMI  24.03 kg/m??        Constitutional: Well developed, well-nourished  female in no acute distress.    CV:  No peripheral swelling noted   Respiratory: No respiratory distress or difficulties   Abdomen:  Soft and nontender. No masses.     GU Female:  deferred   Skin: No bruising or rashes.  No petechia.     Neuro/Psych:  Patient with appropriate affect.  Alert and  oriented.     Lymphatic:   No enlargement of inguinal lymph nodes.      REVIEW OF LABS AND IMAGING:          Results for orders placed or performed in visit on 11/10/18     AMB POC URINALYSIS DIP STICK AUTO W/O MICRO         Result  Value  Ref Range            Color (UA POC)  Yellow         Clarity (UA POC)  Clear         Glucose (UA POC)  Negative  Negative       Bilirubin (UA POC)  Negative  Negative       Ketones (UA POC)  Negative  Negative       Specific gravity (UA POC)  1.020  1.001 - 1.035       Blood (UA POC)  Negative  Negative       pH (UA POC)  6.0  4.6 - 8.0       Protein (UA POC)  Negative  Negative       Urobilinogen (UA POC)  0.2 mg/dL  0.2 - 1       Nitrites (UA POC)  Negative  Negative       Leukocyte esterase (UA POC)  Negative  Negative       AMB POC PVR, MEAS,POST-VOID RES,US,NON-IMAGING         Result  Value  Ref Range            PVR  0  cc        Prior labs or imaging:   Urine Culture 09/20/17   Mixed urogenital flora   Less than 10,000 colonies/mL       Urine culture 08/09/2017:   Proteus mirabilis, greater than 100,000 cfu/mL   AMIKACIN ?? ?? ?? ?? ?? ?? ?? ?? ?? ?? ?? ?? ?? ?? ?? ?? ?? ?? ?? ?? ?? ?? ?? S   AMPICILLIN ?? ?? ?? ?? ?? ?? ??  ?? ?? ?? ?? ?? ?? ?? ?? ?? ?? ?? ?? ?? ?? ?? S   AUGMENTIN  ?? ?? ?? ?? ?? ?? ?? ?? ?? ?? ?? ?? ?? ?? ?? ?? ?? ?? ?? ?? ?? ?? ??S   BACTRIM (SULFA/TRIMETHPRM) ?? ??  ?? ?? ?? ?? ?? ?? ?? ?? ?? ?? ?? ?? S   CEFAZOLIN (ANCEF) ?? ?? ?? ?? ?? ?? ?? ?? ?? ?? ?? ?? ?? ?? ?? ?? ?? ?? ??R   CEFTAZIDIME ?? ?? ?? ?? ?? ?? ?? ?? ?? ?? ??  ?? ?? ?? ?? ?? ?? ?? ?? ?? ?? ??S   CIPRO ?? ?? ?? ?? ?? ?? ?? ?? ?? ?? ?? ?? ?? ?? ?? ?? ?? ?? ?? ?? ?? ?? ?? ?? ??S   GENTAMICIN ?? ?? ?? ?? ?? ?? ?? ??  ?? ?? ?? ?? ?? ?? ?? ?? ?? ?? ?? ?? ?? ?? S   LEVAQUIN ?? ?? ?? ?? ?? ?? ?? ?? ?? ?? ?? ?? ?? ?? ?? ?? ?? ?? ?? ?? ?? ?? ?? S   MACRODANTN (NITROFURANTOIN ?? ?? ??  ?? ?? ?? ?? ?? ?? ?? ?? ?? ?? ?? R   ROCEPHIN (CEFTRIAXONE) ?? ?? ?? ?? ?? ?? ?? ?? ?? ?? ?? ?? ?? ?? ?? ?? S   TETRACYCLINE ?? ?? ?? ?? ?? ?? ?? ?? ?? ?? ?? ?? ??  ?? ?? ?? ?? ?? ?? ?? ??  R   TOBRAMYCIN ?? ?? ?? ?? ?? ?? ?? ?? ?? ?? ?? ?? ?? ?? ?? ?? ?? ?? ?? ?? ?? ?? S   ZOSYN (TAZOBACTAM/PIP) ?? ?? ?? ?? ?? ?? ?? ?? ?? ?? ??  ?? ?? ?? ?? ?? S       Urine culture 08/14/17:   Proteus mirabilis   Greater than 100,000 colony forming units per mL        Ampicillin ($)  Susceptible  S     Cefazolin ($)  Susceptible  S     Cefepime ($$)  Susceptible  S     Ceftriaxone ($)  Susceptible  S     Cefuroxime ($)  Susceptible  S     Ciprofloxacin ($)  Susceptible  S     Ertapenem ($$$$)  Susceptible  S     Gentamicin ($)  Susceptible  S     Levofloxacin ($)  Susceptible  S     Meropenem ($$)  Resistant  R     Nitrofurantoin  Resistant  R     Piperacillin/Tazobac ($)  Susceptible  S     Tetracycline  Resistant  R     Tobramycin ($)  Susceptible  S         Trimeth-Sulfamethoxa  Susceptible  S              Urine culture 07/19/17: (not treated due to low count and asymptomatic)   COLONY COUNT / RESULT:   20,000 cfu/ml     RESULT/ORG ID:   Proteus mirabilis (Isolate 1)     Sensitivity Analysis ?? ?? ?? ?? ?? ?? ?? ??  ?? ?? ?? ?? ?? Isolate 1   -------------------- ?? ?? ?? ?? ?? ?? ?? ?? ?? ?? ?? ?? ?? ---------     AMIKACIN ?? ?? ?? ?? ?? ?? ?? ?? ?? ?? ?? ?? ?? ?? ?? ?? ?? ?? ??  ?? ?? ?? ?? S   AMPICILLIN ?? ?? ?? ?? ?? ?? ?? ?? ?? ?? ?? ?? ?? ?? ?? ?? ?? ?? ?? ?? ?? ?? S   AUGMENTIN ?? ?? ?? ?? ?? ?? ?? ?? ?? ?? ?? ?? ?? ?? ?? ?? ??  ?? ?? ?? ?? ?? ??S   BACTRIM (SULFA/TRIMETHPRM) ?? ?? ?? ?? ?? ?? ?? ?? ?? ?? ?? ?? ?? ?? S   CEFAZOLIN (ANCEF) ?? ?? ?? ?? ?? ?? ?? ?? ?? ?? ?? ?? ?? ?? ?? ?? ?? ?? ??R     CEFTAZIDIME ?? ?? ?? ?? ?? ?? ?? ?? ?? ?? ?? ?? ?? ?? ?? ?? ?? ?? ?? ?? ?? ??S   CIPRO ?? ?? ?? ?? ?? ?? ?? ?? ?? ?? ?? ?? ?? ?? ?? ?? ?? ?? ?? ?? ?? ?? ?? ??  ??S   GENTAMICIN ?? ?? ?? ?? ?? ?? ?? ?? ?? ?? ?? ?? ?? ?? ?? ?? ?? ?? ?? ?? ?? ?? S   LEVAQUIN ?? ?? ?? ?? ?? ?? ?? ?? ?? ?? ?? ?? ?? ?? ?? ?? ?? ?? ?? ?? ??  ?? ?? S   MACRODANTN (NITROFURANTOIN ?? ?? ?? ?? ?? ?? ?? ?? ?? ?? ?? ?? ?? ?? R   ROCEPHIN (CEFTRIAXONE) ?? ?? ?? ?? ?? ?? ?? ?? ?? ?? ?? ?? ?? ?? ?? ?? S   TETRACYCLINE ??  ?? ?? ?? ?? ?? ?? ?? ?? ?? ?? ?? ?? ?? ?? ?? ?? ?? ?? ?? ??  R   TOBRAMYCIN ?? ?? ?? ?? ?? ?? ?????? ?? ?? ?? ?? ?? ?? ?? ?? ?? ?? ?? ?? ?? ?? S   ZOSYN (TAZOBACTAM/PIP)  ?? ?? ?? ?? ?? ?? ?? ?? ?? ?? ?? ?? ?? ?? ?? ?? S       Urine culture 06/03/2017 - Mixed Culture          A copy of today's office visit with all pertinent imaging results and labs were sent to the referring physician.        Michaell Cowing, M.D.,   [facility]   a division of [facility]      [place], [place]   [phone] ext [phone]   [phone] fax      Documentation was provided with the assistance of Theone Murdoch, medical scribe for Brantley Fling, MD.

## 2018-11-10 NOTE — Progress Notes (Signed)
ASSESSMENT:     ICD-10-CM ICD-9-CM    1. Recurrent UTI N39.0 599.0 AMB POC URINALYSIS DIP STICK AUTO W/O MICRO      AMB POC PVR, MEAS,POST-VOID RES,US,NON-IMAGING   2. Urinary frequency R35.0 788.41 AMB POC URINALYSIS DIP STICK AUTO W/O MICRO      AMB POC PVR, MEAS,POST-VOID RES,US,NON-IMAGING   3. Acute cystitis without hematuria N30.00 595.0 AMB POC URINALYSIS DIP STICK AUTO W/O MICRO      AMB POC PVR, MEAS,POST-VOID RES,US,NON-IMAGING        PLAN:    1. Recurrent UTI  - Proteus mirabilis UTI treated with Gent bladder instillations on 08/29/2017. No additional infections.    Continue post coital prophylaxis Nitrofurantoin.     UA today negative for infection.   PVR today 0cc.     RTC in 1 year for UTI f/u; sooner if neeed.   She can cancel the appointment if she has no issues in the interim.     DISCUSSION:      Chief Complaint   Patient presents with   ??? Recurrent UTI     Patient states that she has no complaints       History of Present Illness:  Michelle Snyder is a 77 y.o. female who presents today in follow up for recurrent UTI's.  She has been doing very well in the interim and has no complaints today.  No breakthrough infections in the interim.  She continued on post coital nitrofurantoin although she admits to decreased frequency as her husband has been sick.  No dysuria, gross hematuria, f/c/n/v.         PRIOR VISIT:  04/25/2018: Patient had repeat Proteus mirabilis UTI on 08/14/2017 and subsequently began po Amoxicillin 500 mg BID x5 days. She was later treated for Proteus mirabilis UTI on 08/29/2017 with [facility] bladder instillations 160 mg daily x5 days in [place]. Repeat urine culture was unremarkable on 09/20/2017.  SHe has been doing well since her last visit.       07/12/2017:  Recently had oral surgery and completed a course of Clindamycin. After completing the Clindamycin she reports she developed UTI symptoms, increased dysuria, frequency, urgency. Took a course of Augmentin she had  at home. A few days later developed severe diarrhea. Went to the ER and was found to have C. Difficile. Treated with vancomycin. Diarrhea later returned and was put back on Vancomycin by her PCP, has one week left. Diarrhea has improved. Took Culturelle, but stopped Now on Saccharomyces boulardii + MOS with benefit. Leaving in late August for a cruise.    01/07/17  presents today in follow up for recurrent UTI. Patient has done well over the interim, no recent infections and currently asymptomatic. Taking Macrobid post coitally without any issues. Coffee and carbonated drinks serve as a trigger for dysuria. Has minor episode of right 'pinching' sensation, ?UTI however symptoms resolved shortly after a BM. States she is due to have a prolia injection today and believes this may be trigger for her infections. ?Microhematuria presence being a precursor for UTI's.  Denies flank pain, gross hematuria and dysuria. No f/c/n/v.     Has previously been treated with Cipro, Augmentin, Nitrofurantoin, Macrobid - admits UTI associated symptoms did not resolved with course of Cipro.     12/30/15: She denies any infection since we last met in May 2016. She continues on Macrobid 100 mg post coitus.  She had a negative hematuria work up in 2015.  No issues at this  time. She denies any urinary frequency, urgency, incomplete bladder emptying.  She is asymptomatic for UTI today. No dysuria, gross hematuria. No f/c/n/v.     5//20/16: She presents today for follow up and management of recurrent infections.   Patient states that she recently finished Cipro and has not experiencing and symptoms associated with an infection. No current symptoms. Denies any dysuria, hematuria, or voiding symptoms.   No f/n/c/v.     01/2014 who presents today with complaint of dysuria, frequency, urgency cloudy urine, and pelvic pressure that resolved 1 week ago; these have resolved now. In October, Dr. Georgann Housekeeper performed UA, provided  patient Macrobid 100 mg to use post-coitally. She had it on hand while on a cruise in December; used Macrobid 12/01/14 - 12/08/13. She continued with symptoms until 01/16/14. She uses macrobid postcoitally.     01/21/14 Prior history obtained on 01/21/14: for reevaluation. She reports that she felt she had a UTI just prior to Christmas. UA and culture were negative. She reports the passage of small amts of sand. Reports RLQ pain which increases with caffeine. Reports it is an ache. Locates it to right suprapubic pain.  Occ vaginal spotting and using vaginal estrogen. Denies SUI.        REVIEW OF SYSTEMS:     Constitutional: Fever: No  Skin: Rash: No  HEENT: Hearing difficulty: No  Eyes: Blurred vision: No  Cardiovascular: Chest pain: No  Respiratory: Shortness of breath: No  Gastrointestinal: Nausea/vomiting: No  Musculoskeletal: Back pain: No  Neurological: Weakness: No  Psychological: Memory loss: No  Comments/additional findings:       Past Medical History:   Diagnosis Date   ??? Cystocele    ??? Cystocele, unspecified    ??? High cholesterol    ??? Recurrent UTI    ??? Urinary frequency    ??? Urinary incontinence    ??? UTI (urinary tract infection)        Past Surgical History:   Procedure Laterality Date   ??? HX KNEE REPLACEMENT  2/20110    partial    ??? HX OTHER SURGICAL  2005    PLASTIC SURGERY   ??? HX OTHER SURGICAL  1971    FINE NEEDLE ASPIRATE, CYTOLOGY   ??? HX OTHER SURGICAL  12/20/2008    CYSTOCELE REPAIR SLING POST PROCEDURE ORDERSET 517   ??? HX POLYPECTOMY  2007   ??? HX UROLOGICAL  01/2013    bladder sling       Family History   Problem Relation Age of Onset   ??? Heart Disease Mother    ??? Heart Disease Father        Social History     Tobacco Use   ??? Smoking status: Former Smoker   ??? Smokeless tobacco: Never Used   Substance Use Topics   ??? Alcohol use: Yes   ??? Drug use: No       Allergies   Allergen Reactions   ??? Alendronate Other (comments), Itching and Shortness of Breath   ??? Clindamycin Other (comments)    ??? Sulfa (Sulfonamide Antibiotics) Other (comments)     50 yrs ago   ??? Sulfasalazine Other (comments)     Other reaction(s): Dermatological problems, e.g., rash, hives       Current Outpatient Medications   Medication Sig Dispense Refill   ??? nitrofurantoin, macrocrystal-monohydrate, (MACROBID) 100 mg capsule Take 1 Cap by mouth nightly as needed (coitus). 30 Cap 3   ??? therapeutic multivitamin-minerals (VITAMINS AND MINERALS) tablet  Take 1 Tab by mouth.     ??? denosumab (PROLIA) 60 mg/mL injection 60 mg by SubCUTAneous route.     ??? levothyroxine (SYNTHROID) 175 mcg tablet 0.075 mg.     ??? atorvastatin (LIPITOR) 10 mg tablet      ??? aspirin delayed-release 81 mg tablet Take  by mouth daily.     ??? raloxifene (EVISTA) 60 mg tablet Take  by mouth daily.     ??? cholecalciferol, vitamin D3, (VITAMIN D3) 2,000 unit tab Take  by mouth.     ??? multivitamin (ONE A DAY) tablet Take 1 Tab by mouth daily.     ??? naproxen sodium (ALEVE) 220 mg tablet Take 220 mg by mouth two (2) times daily (with meals).         OB History     Gravida   3    Para   2    Term   2    Preterm   0    AB   1    Living   0       SAB   1    TAB   0    Ectopic   0    Molar        Multiple   0    Live Births                  PHYSICAL EXAMINATION:     Visit Vitals  BP 128/90   Ht '5\' 4"'  (1.626 m)   Wt 140 lb (63.5 kg)   BMI 24.03 kg/m??     Constitutional: Well developed, well-nourished female in no acute distress.   CV:  No peripheral swelling noted  Respiratory: No respiratory distress or difficulties  Abdomen:  Soft and nontender. No masses.    GU Female:  deferred  Skin: No bruising or rashes.  No petechia.    Neuro/Psych:  Patient with appropriate affect.  Alert and oriented.    Lymphatic:   No enlargement of inguinal lymph nodes.    REVIEW OF LABS AND IMAGING:      Results for orders placed or performed in visit on 11/10/18   AMB POC URINALYSIS DIP STICK AUTO W/O MICRO   Result Value Ref Range    Color (UA POC) Yellow     Clarity (UA POC) Clear      Glucose (UA POC) Negative Negative    Bilirubin (UA POC) Negative Negative    Ketones (UA POC) Negative Negative    Specific gravity (UA POC) 1.020 1.001 - 1.035    Blood (UA POC) Negative Negative    pH (UA POC) 6.0 4.6 - 8.0    Protein (UA POC) Negative Negative    Urobilinogen (UA POC) 0.2 mg/dL 0.2 - 1    Nitrites (UA POC) Negative Negative    Leukocyte esterase (UA POC) Negative Negative   AMB POC PVR, MEAS,POST-VOID RES,US,NON-IMAGING   Result Value Ref Range    PVR 0 cc     Prior labs or imaging:  Urine Culture 09/20/17  Mixed urogenital flora   Less than 10,000 colonies/mL     Urine culture 08/09/2017:  Proteus mirabilis, greater than 100,000 cfu/mL  AMIKACIN ?? ?? ?? ?? ?? ?? ?? ?? ?? ?? ?? ?? ?? ?? ?? ?? ?? ?? ?? ?? ?? ?? ?? S   AMPICILLIN ?? ?? ?? ?? ?? ?? ?? ?? ?? ?? ?? ?? ?? ?? ?? ?? ?? ?? ?? ?? ?? ??  S   AUGMENTIN ?? ?? ?? ?? ?? ?? ?? ?? ?? ?? ?? ?? ?? ?? ?? ?? ?? ?? ?? ?? ?? ?? ??S   BACTRIM (SULFA/TRIMETHPRM) ?? ?? ?? ?? ?? ?? ?? ?? ?? ?? ?? ?? ?? ?? S   CEFAZOLIN (ANCEF) ?? ?? ?? ?? ?? ?? ?? ?? ?? ?? ?? ?? ?? ?? ?? ?? ?? ?? ??R   CEFTAZIDIME ?? ?? ?? ?? ?? ?? ?? ?? ?? ?? ?? ?? ?? ?? ?? ?? ?? ?? ?? ?? ?? ??S   CIPRO ?? ?? ?? ?? ?? ?? ?? ?? ?? ?? ?? ?? ?? ?? ?? ?? ?? ?? ?? ?? ?? ?? ?? ?? ??S   GENTAMICIN ?? ?? ?? ?? ?? ?? ?? ?? ?? ?? ?? ?? ?? ?? ?? ?? ?? ?? ?? ?? ?? ?? S   LEVAQUIN ?? ?? ?? ?? ?? ?? ?? ?? ?? ?? ?? ?? ?? ?? ?? ?? ?? ?? ?? ?? ?? ?? ?? S   MACRODANTN (NITROFURANTOIN ?? ?? ?? ?? ?? ?? ?? ?? ?? ?? ?? ?? ?? ?? R   ROCEPHIN (CEFTRIAXONE) ?? ?? ?? ?? ?? ?? ?? ?? ?? ?? ?? ?? ?? ?? ?? ?? S   TETRACYCLINE ?? ?? ?? ?? ?? ?? ?? ?? ?? ?? ?? ?? ?? ?? ?? ?? ?? ?? ?? ?? ?? R   TOBRAMYCIN ?? ?? ?? ?? ?? ?? ?? ?? ?? ?? ?? ?? ?? ?? ?? ?? ?? ?? ?? ?? ?? ?? S   ZOSYN (TAZOBACTAM/PIP) ?? ?? ?? ?? ?? ?? ?? ?? ?? ?? ?? ?? ?? ?? ?? ?? S     Urine culture 08/14/17:  Proteus mirabilis   Greater than 100,000 colony forming units per mL   Ampicillin ($) Susceptible S   Cefazolin ($) Susceptible S   Cefepime ($$) Susceptible S   Ceftriaxone ($) Susceptible S   Cefuroxime ($) Susceptible S   Ciprofloxacin ($) Susceptible S   Ertapenem ($$$$) Susceptible S   Gentamicin ($) Susceptible S   Levofloxacin ($) Susceptible S   Meropenem ($$) Resistant R   Nitrofurantoin Resistant R    Piperacillin/Tazobac ($) Susceptible S   Tetracycline Resistant R   Tobramycin ($) Susceptible S   Trimeth-Sulfamethoxa Susceptible S         Urine culture 07/19/17: (not treated due to low count and asymptomatic)  COLONY COUNT / RESULT:   20,000 cfu/ml     RESULT/ORG ID:   Proteus mirabilis (Isolate 1)     Sensitivity Analysis ?? ?? ?? ?? ?? ?? ?? ?? ?? ?? ?? ?? ?? Isolate 1   -------------------- ?? ?? ?? ?? ?? ?? ?? ?? ?? ?? ?? ?? ?? ---------     AMIKACIN ?? ?? ?? ?? ?? ?? ?? ?? ?? ?? ?? ?? ?? ?? ?? ?? ?? ?? ?? ?? ?? ?? ?? S   AMPICILLIN ?? ?? ?? ?? ?? ?? ?? ?? ?? ?? ?? ?? ?? ?? ?? ?? ?? ?? ?? ?? ?? ?? S   AUGMENTIN ?? ?? ?? ?? ?? ?? ?? ?? ?? ?? ?? ?? ?? ?? ?? ?? ?? ?? ?? ?? ?? ?? ??S   BACTRIM (SULFA/TRIMETHPRM) ?? ?? ?? ?? ?? ?? ?? ?? ?? ?? ?? ?? ?? ?? S   CEFAZOLIN (ANCEF) ?? ?? ?? ?? ?? ?? ?? ?? ?? ?? ?? ?? ?? ?? ?? ?? ?? ?? ??R   CEFTAZIDIME ?? ?? ?? ?? ?? ?? ?? ?? ?? ?? ?? ?? ?? ?? ?? ?? ?? ?? ?? ?? ?? ??  S   CIPRO ?? ?? ?? ?? ?? ?? ?? ?? ?? ?? ?? ?? ?? ?? ?? ?? ?? ?? ?? ?? ?? ?? ?? ?? ??S   GENTAMICIN ?? ?? ?? ?? ?? ?? ?? ?? ?? ?? ?? ?? ?? ?? ?? ?? ?? ?? ?? ?? ?? ?? S   LEVAQUIN ?? ?? ?? ?? ?? ?? ?? ?? ?? ?? ?? ?? ?? ?? ?? ?? ?? ?? ?? ?? ?? ?? ?? S   MACRODANTN (NITROFURANTOIN ?? ?? ?? ?? ?? ?? ?? ?? ?? ?? ?? ?? ?? ?? R   ROCEPHIN (CEFTRIAXONE) ?? ?? ?? ?? ?? ?? ?? ?? ?? ?? ?? ?? ?? ?? ?? ?? S   TETRACYCLINE ?? ?? ?? ?? ?? ?? ?? ?? ?? ?? ?? ?? ?? ?? ?? ?? ?? ?? ?? ?? ?? R   TOBRAMYCIN ?? ?? ?? ?? ?? ?? ?????? ?? ?? ?? ?? ?? ?? ?? ?? ?? ?? ?? ?? ?? ?? S   ZOSYN (TAZOBACTAM/PIP) ?? ?? ?? ?? ?? ?? ?? ?? ?? ?? ?? ?? ?? ?? ?? ?? S     Urine culture 06/03/2017 - Mixed Culture       A copy of today's office visit with all pertinent imaging results and labs were sent to the referring physician.      Michaell Cowing, M.D.,   [facility]  a division of [facility]    [place], [place]  [phone] ext [phone]  [phone] fax    Documentation was provided with the assistance of Theone Murdoch, medical scribe for Brantley Fling, MD.

## 2019-05-28 NOTE — Telephone Encounter (Signed)
A user error has taken place: encounter opened in error, closed for administrative reasons.

## 2019-11-16 ENCOUNTER — Encounter: Attending: Urology

## 2019-11-16 ENCOUNTER — Ambulatory Visit: Admit: 2019-11-16 | Discharge: 2019-11-16 | Payer: MEDICARE | Attending: Urology

## 2019-11-16 ENCOUNTER — Ambulatory Visit: Attending: Urology

## 2019-11-16 DIAGNOSIS — N390 Urinary tract infection, site not specified: Secondary | ICD-10-CM

## 2019-11-16 LAB — AMB POC URINALYSIS DIP STICK AUTO W/O MICRO
Bilirubin (UA POC): NEGATIVE
Bilirubin, Urine, POC: NEGATIVE
Blood (UA POC): NEGATIVE
Blood (UA POC): NEGATIVE
Glucose (UA POC): NEGATIVE
Glucose, Urine, POC: NEGATIVE
Ketones (UA POC): NEGATIVE
Ketones, Urine, POC: NEGATIVE
Nitrite, Urine, POC: NEGATIVE
Nitrites (UA POC): NEGATIVE
Protein (UA POC): NEGATIVE
Protein, Urine, POC: NEGATIVE
Specific Gravity, Urine, POC: 1.015 NA (ref 1.001–1.035)
Specific gravity (UA POC): 1.015 (ref 1.001–1.035)
Urobilinogen (UA POC): 0.2 (ref 0.2–1)
Urobilinogen, POC: 0.2 (ref 0.2–1)
pH (UA POC): 6.5 (ref 4.6–8.0)
pH, Urine, POC: 6.5 NA (ref 4.6–8.0)

## 2019-11-16 NOTE — Progress Notes (Signed)
Progress  Notes by Posey Pronto at 11/16/19 1515                Author: Posey Pronto  Service: --  Author TypeDagoberto Reef       Filed: 11/16/19 1604  Encounter Date: 11/16/2019  Status: Signed          Editor: Monica Becton, FNP (Nurse Practitioner)                       ASSESSMENT:              ICD-10-CM  ICD-9-CM             1.  Recurrent UTI   N39.0  599.0  AMB POC URINALYSIS DIP STICK AUTO W/O MICRO            PLAN:     1. Recurrent UTI  - Proteus mirabilis UTI treated with Gent bladder instillations on 08/29/2017. No additional infections.     Continue post coital prophylaxis Nitrofurantoin.      UA today positive for leuk, asymptomatic for infection possible poor collection.           RTC 1 yr ; sooner if difficulty.    All questions answered.          DISCUSSION:          Chief Complaint       Patient presents with        ?  UTI             Annual f/u pt is doing well and has not had any UTI.            History of Present Illness:  Michelle Snyder is a 78 y.o.  female who presents today in follow up for recurrent UTI's.       She has been doing well in the interim. She reported no UTI's x 1 year. Drinking well. Voiding well, no urgency, no frequency. No f/c/n/v.             PRIOR HX:   11/10/2018: She has been doing very well in the interim and has no complaints today.  No breakthrough infections in the interim.   She continued on post coital nitrofurantoin although she admits to decreased frequency as her husband has been sick.  No dysuria, gross hematuria, f/c/n/v.       04/25/2018: Patient had repeat Proteus mirabilis UTI on 08/14/2017 and subsequently began po Amoxicillin 500 mg BID x5 days.  She was later treated for Proteus mirabilis UTI on 08/29/2017 with [facility] bladder instillations 160 mg daily x5 days in [place]. Repeat urine culture was unremarkable on 09/20/2017.   SHe has been doing well since her last visit.          07/12/2017:   Recently had oral surgery and completed a course of  Clindamycin. After completing the Clindamycin she reports she developed UTI symptoms, increased dysuria, frequency, urgency. Took  a course of Augmentin she had at home. A few days later developed severe diarrhea. Went to the ER and was found to have C. Difficile. Treated with vancomycin. Diarrhea later returned and was put back on Vancomycin by her PCP, has one week left. Diarrhea  has improved. Took Culturelle, but stopped Now on Saccharomyces boulardii + MOS with benefit. Leaving in late August for a cruise.      01/07/17   presents today in follow up for recurrent UTI. Patient has  done well over the interim, no recent infections and currently asymptomatic. Taking Macrobid post coitally without any issues. Coffee and carbonated drinks serve as a trigger for dysuria. Has  minor episode of right 'pinching' sensation, ?UTI however symptoms resolved shortly after a BM. States she is due to have a prolia injection today and believes this may be trigger for her infections. ?Microhematuria presence being a precursor for UTI's.   Denies flank pain, gross hematuria and dysuria. No f/c/n/v.       Has previously been treated with Cipro, Augmentin, Nitrofurantoin, Macrobid - admits UTI associated symptoms did not resolved with course of Cipro.       12/30/15: She denies any infection since we last met in May 2016. She continues on Macrobid 100 mg post coitus.  She had a negative hematuria  work up in 2015.  No issues at this time. She denies any urinary frequency, urgency, incomplete bladder emptying.  She is asymptomatic for UTI today. No dysuria, gross hematuria. No f/c/n/v.       5//20/16: She presents today for follow up and management of recurrent infections.    Patient states that she recently finished Cipro and has not experiencing and symptoms associated with an infection. No current symptoms. Denies any dysuria, hematuria, or voiding  symptoms.    No f/n/c/v.       01/2014 who presents today with complaint of dysuria,  frequency, urgency cloudy urine, and pelvic pressure that resolved 1 week ago; these  have resolved now. In October, Dr. Ventura Sellers performed UA, provided patient Macrobid 100 mg to use post-coitally. She had it on hand while on a cruise in December; used Macrobid 12/01/14 - 12/08/13. She continued with symptoms until 01/16/14. She  uses macrobid postcoitally.       01/21/14 Prior history obtained on 01/21/14: for reevaluation. She reports that she felt she had a UTI just prior to Christmas. UA and culture  were negative. She reports the passage of small amts of sand. Reports RLQ pain which increases with caffeine. Reports it is an ache. Locates it to right suprapubic pain.  Occ vaginal spotting and using vaginal estrogen. Denies SUI.           REVIEW OF SYSTEMS:       Constitutional: Fever: No   Skin: Rash: No   HEENT: Hearing difficulty: No   Eyes: Blurred vision: No   Cardiovascular: Chest pain: No   Respiratory: Shortness of breath: No   Gastrointestinal: Nausea/vomiting: No   Musculoskeletal: Back pain: No   Neurological: Weakness: No   Psychological: Memory loss: No   Comments/additional findings:            Past Medical History:        Diagnosis  Date         ?  Cystocele       ?  Cystocele, unspecified           ?  High cholesterol           ?  Recurrent UTI       ?  Urinary frequency       ?  Urinary incontinence           ?  UTI (urinary tract infection)               Past Surgical History:         Procedure  Laterality  Date          ?  HX  KNEE REPLACEMENT    2/20110          partial           ?  HX OTHER SURGICAL    2005          PLASTIC SURGERY          ?  HX OTHER SURGICAL    1971          FINE NEEDLE ASPIRATE, CYTOLOGY          ?  HX OTHER SURGICAL    12/20/2008          CYSTOCELE REPAIR SLING POST PROCEDURE ORDERSET 517          ?  HX POLYPECTOMY    2007     ?  HX UROLOGICAL    01/2013          bladder sling             Family History         Problem  Relation  Age of Onset          ?  Heart Disease   Mother            ?  Heart Disease  Father               Social History          Tobacco Use         ?  Smoking status:  Former Smoker     ?  Smokeless tobacco:  Never Used       Substance Use Topics         ?  Alcohol use:  Yes         ?  Drug use:  No             Allergies        Allergen  Reactions         ?  Alendronate  Other (comments), Itching and Shortness of Breath     ?  Clindamycin  Other (comments)     ?  Sulfa (Sulfonamide Antibiotics)  Other (comments)             50 yrs ago         ?  Sulfasalazine  Other (comments)             Other reaction(s): Dermatological problems, e.g., rash, hives             Current Outpatient Medications          Medication  Sig  Dispense  Refill           ?  nitrofurantoin, macrocrystal-monohydrate, (MACROBID) 100 mg capsule  Take 1 Cap by mouth nightly as needed (coitus).  30 Cap  3     ?  therapeutic multivitamin-minerals (VITAMINS AND MINERALS) tablet  Take 1 Tab by mouth.               ?  denosumab (PROLIA) 60 mg/mL injection  60 mg by SubCUTAneous route.               ?  levothyroxine (SYNTHROID) 175 mcg tablet  0.075 mg.         ?  atorvastatin (LIPITOR) 10 mg tablet           ?  aspirin delayed-release 81 mg tablet  Take  by mouth daily.         ?  raloxifene (EVISTA) 60 mg  tablet  Take  by mouth daily.         ?  cholecalciferol, vitamin D3, (VITAMIN D3) 2,000 unit tab  Take  by mouth.         ?  multivitamin (ONE A DAY) tablet  Take 1 Tab by mouth daily.               ?  naproxen sodium (ALEVE) 220 mg tablet  Take 220 mg by mouth two (2) times daily (with meals).                 OB History                Gravida      3                Para      2                Term      2                Preterm      0                AB      1                Living      0                             SAB      1                TAB      0                Ectopic      0                Molar                       Multiple      0                Live Births                                         PHYSICAL EXAMINATION:       Visit Vitals      Resp  16     Ht  5\' 4"  (1.626 m)     Wt  140 lb (63.5 kg)        BMI  24.03 kg/m        Constitutional: Well developed, well-nourished  female in no acute distress.    CV:  No peripheral swelling noted   Respiratory: No respiratory distress or difficulties   Abdomen:  Soft and nontender. No masses.     GU Female:  deferred   Skin: No bruising or rashes.  No petechia.     Neuro/Psych:  Patient with appropriate affect.  Alert and oriented.     Lymphatic:   No enlargement of inguinal lymph nodes.      REVIEW OF LABS AND IMAGING:          Results for orders placed or performed in visit on 11/16/19     AMB POC URINALYSIS DIP STICK AUTO W/O MICRO  Result  Value  Ref Range            Color (UA POC)  Yellow         Clarity (UA POC)  Clear         Glucose (UA POC)  Negative  Negative       Bilirubin (UA POC)  Negative  Negative       Ketones (UA POC)  Negative  Negative       Specific gravity (UA POC)  1.015  1.001 - 1.035       Blood (UA POC)  Negative  Negative       pH (UA POC)  6.5  4.6 - 8.0       Protein (UA POC)  Negative  Negative       Urobilinogen (UA POC)  0.2 mg/dL  0.2 - 1       Nitrites (UA POC)  Negative  Negative            Leukocyte esterase (UA POC)  2+  Negative        Prior labs or imaging:   Urine Culture 09/20/17   Mixed urogenital flora   Less than 10,000 colonies/mL       Urine culture 08/09/2017:   Proteus mirabilis, greater than 100,000 cfu/mL   AMIKACIN                        S   AMPICILLIN                        S   AUGMENTIN                       S   BACTRIM (SULFA/TRIMETHPRM)                S   CEFAZOLIN (ANCEF)                   R   CEFTAZIDIME                       S   CIPRO                         S   GENTAMICIN                        S   LEVAQUIN                         S   MACRODANTN (NITROFURANTOIN                R   ROCEPHIN (CEFTRIAXONE)                 S   TETRACYCLINE                       R   TOBRAMYCIN                       S   ZOSYN (TAZOBACTAM/PIP)                  S       Urine culture 08/14/17:   Proteus mirabilis   Greater than 100,000 colony forming units per mL  Ampicillin ($)  Susceptible  S     Cefazolin ($)  Susceptible  S     Cefepime ($$)  Susceptible  S     Ceftriaxone ($)  Susceptible  S     Cefuroxime ($)  Susceptible  S     Ciprofloxacin ($)  Susceptible  S     Ertapenem ($$$$)  Susceptible  S     Gentamicin ($)  Susceptible  S     Levofloxacin ($)  Susceptible  S     Meropenem ($$)  Resistant  R     Nitrofurantoin  Resistant  R     Piperacillin/Tazobac ($)  Susceptible  S     Tetracycline  Resistant  R     Tobramycin ($)  Susceptible  S     Trimeth-Sulfamethoxa  Susceptible  S              Urine culture 07/19/17: (not treated due to low count and asymptomatic)   COLONY COUNT / RESULT:   20,000 cfu/ml     RESULT/ORG ID:   Proteus mirabilis (Isolate 1)     Sensitivity Analysis               Isolate 1   --------------------              ---------     AMIKACIN                         S   AMPICILLIN                       S   AUGMENTIN                        S   BACTRIM (SULFA/TRIMETHPRM)               S   CEFAZOLIN (ANCEF)                   R    CEFTAZIDIME                      S   CIPRO                          S   GENTAMICIN                       S   LEVAQUIN                         S   MACRODANTN (NITROFURANTOIN               R   ROCEPHIN (CEFTRIAXONE)                 S   TETRACYCLINE                        R   TOBRAMYCIN                      S   ZOSYN (TAZOBACTAM/PIP)                  S       Urine culture 06/03/2017 - Mixed Culture          A copy of  today's office visit with all pertinent imaging results and labs were sent to the referring physician.        Monica Becton, FNP

## 2019-11-16 NOTE — Progress Notes (Signed)
ASSESSMENT:     ICD-10-CM ICD-9-CM    1. Recurrent UTI  N39.0 599.0 AMB POC URINALYSIS DIP STICK AUTO W/O MICRO        PLAN:    1. Recurrent UTI  - Proteus mirabilis UTI treated with Gent bladder instillations on 08/29/2017. No additional infections.    Continue post coital prophylaxis Nitrofurantoin.     UA today positive for leuk, asymptomatic for infection possible poor collection.        RTC 1 yr ; sooner if difficulty.   All questions answered.       DISCUSSION:      Chief Complaint   Patient presents with   ??? UTI     Annual f/u pt is doing well and has not had any UTI.        History of Present Illness:  Michelle Snyder is a 78 y.o. female who presents today in follow up for recurrent UTI's.     She has been doing well in the interim. She reported no UTI's x 1 year. Drinking well. Voiding well, no urgency, no frequency. No f/c/n/v.         PRIOR HX:  11/10/2018: She has been doing very well in the interim and has no complaints today.  No breakthrough infections in the interim.  She continued on post coital nitrofurantoin although she admits to decreased frequency as her husband has been sick.  No dysuria, gross hematuria, f/c/n/v.     04/25/2018: Patient had repeat Proteus mirabilis UTI on 08/14/2017 and subsequently began po Amoxicillin 500 mg BID x5 days. She was later treated for Proteus mirabilis UTI on 08/29/2017 with [facility] bladder instillations 160 mg daily x5 days in [place]. Repeat urine culture was unremarkable on 09/20/2017.  SHe has been doing well since her last visit.       07/12/2017:  Recently had oral surgery and completed a course of Clindamycin. After completing the Clindamycin she reports she developed UTI symptoms, increased dysuria, frequency, urgency. Took a course of Augmentin she had at home. A few days later developed severe diarrhea. Went to the ER and was found to have C. Difficile. Treated with vancomycin. Diarrhea later  returned and was put back on Vancomycin by her PCP, has one week left. Diarrhea has improved. Took Culturelle, but stopped Now on Saccharomyces boulardii + MOS with benefit. Leaving in late August for a cruise.    01/07/17  presents today in follow up for recurrent UTI. Patient has done well over the interim, no recent infections and currently asymptomatic. Taking Macrobid post coitally without any issues. Coffee and carbonated drinks serve as a trigger for dysuria. Has minor episode of right 'pinching' sensation, ?UTI however symptoms resolved shortly after a BM. States she is due to have a prolia injection today and believes this may be trigger for her infections. ?Microhematuria presence being a precursor for UTI's.  Denies flank pain, gross hematuria and dysuria. No f/c/n/v.     Has previously been treated with Cipro, Augmentin, Nitrofurantoin, Macrobid - admits UTI associated symptoms did not resolved with course of Cipro.     12/30/15: She denies any infection since we last met in May 2016. She continues on Macrobid 100 mg post coitus.  She had a negative hematuria work up in 2015.  No issues at this time. She denies any urinary frequency, urgency, incomplete bladder emptying.  She is asymptomatic for UTI today. No dysuria, gross hematuria. No f/c/n/v.     5//20/16: She presents  today for follow up and management of recurrent infections.   Patient states that she recently finished Cipro and has not experiencing and symptoms associated with an infection. No current symptoms. Denies any dysuria, hematuria, or voiding symptoms.   No f/n/c/v.     01/2014 who presents today with complaint of dysuria, frequency, urgency cloudy urine, and pelvic pressure that resolved 1 week ago; these have resolved now. In October, Dr. Georgann Housekeeper performed UA, provided patient Macrobid 100 mg to use post-coitally. She had it on hand while on a cruise in December; used Macrobid 12/01/14 - 12/08/13. She continued with  symptoms until 01/16/14. She uses macrobid postcoitally.     01/21/14 Prior history obtained on 01/21/14: for reevaluation. She reports that she felt she had a UTI just prior to Christmas. UA and culture were negative. She reports the passage of small amts of sand. Reports RLQ pain which increases with caffeine. Reports it is an ache. Locates it to right suprapubic pain.  Occ vaginal spotting and using vaginal estrogen. Denies SUI.        REVIEW OF SYSTEMS:     Constitutional: Fever: No  Skin: Rash: No  HEENT: Hearing difficulty: No  Eyes: Blurred vision: No  Cardiovascular: Chest pain: No  Respiratory: Shortness of breath: No  Gastrointestinal: Nausea/vomiting: No  Musculoskeletal: Back pain: No  Neurological: Weakness: No  Psychological: Memory loss: No  Comments/additional findings:       Past Medical History:   Diagnosis Date   ??? Cystocele    ??? Cystocele, unspecified    ??? High cholesterol    ??? Recurrent UTI    ??? Urinary frequency    ??? Urinary incontinence    ??? UTI (urinary tract infection)        Past Surgical History:   Procedure Laterality Date   ??? HX KNEE REPLACEMENT  2/20110    partial    ??? HX OTHER SURGICAL  2005    PLASTIC SURGERY   ??? HX OTHER SURGICAL  1971    FINE NEEDLE ASPIRATE, CYTOLOGY   ??? HX OTHER SURGICAL  12/20/2008    CYSTOCELE REPAIR SLING POST PROCEDURE ORDERSET 517   ??? HX POLYPECTOMY  2007   ??? HX UROLOGICAL  01/2013    bladder sling       Family History   Problem Relation Age of Onset   ??? Heart Disease Mother    ??? Heart Disease Father        Social History     Tobacco Use   ??? Smoking status: Former Smoker   ??? Smokeless tobacco: Never Used   Substance Use Topics   ??? Alcohol use: Yes   ??? Drug use: No       Allergies   Allergen Reactions   ??? Alendronate Other (comments), Itching and Shortness of Breath   ??? Clindamycin Other (comments)   ??? Sulfa (Sulfonamide Antibiotics) Other (comments)     50 yrs ago   ??? Sulfasalazine Other (comments)      Other reaction(s): Dermatological problems, e.g., rash, hives       Current Outpatient Medications   Medication Sig Dispense Refill   ??? nitrofurantoin, macrocrystal-monohydrate, (MACROBID) 100 mg capsule Take 1 Cap by mouth nightly as needed (coitus). 30 Cap 3   ??? therapeutic multivitamin-minerals (VITAMINS AND MINERALS) tablet Take 1 Tab by mouth.     ??? denosumab (PROLIA) 60 mg/mL injection 60 mg by SubCUTAneous route.     ??? levothyroxine (SYNTHROID) 175 mcg tablet  0.075 mg.     ??? atorvastatin (LIPITOR) 10 mg tablet      ??? aspirin delayed-release 81 mg tablet Take  by mouth daily.     ??? raloxifene (EVISTA) 60 mg tablet Take  by mouth daily.     ??? cholecalciferol, vitamin D3, (VITAMIN D3) 2,000 unit tab Take  by mouth.     ??? multivitamin (ONE A DAY) tablet Take 1 Tab by mouth daily.     ??? naproxen sodium (ALEVE) 220 mg tablet Take 220 mg by mouth two (2) times daily (with meals).         OB History     Gravida   3    Para   2    Term   2    Preterm   0    AB   1    Living   0       SAB   1    TAB   0    Ectopic   0    Molar        Multiple   0    Live Births                  PHYSICAL EXAMINATION:     Visit Vitals  Resp 16   Ht 5' 4" (1.626 m)   Wt 140 lb (63.5 kg)   BMI 24.03 kg/m??     Constitutional: Well developed, well-nourished female in no acute distress.   CV:  No peripheral swelling noted  Respiratory: No respiratory distress or difficulties  Abdomen:  Soft and nontender. No masses.    GU Female:  deferred  Skin: No bruising or rashes.  No petechia.    Neuro/Psych:  Patient with appropriate affect.  Alert and oriented.    Lymphatic:   No enlargement of inguinal lymph nodes.    REVIEW OF LABS AND IMAGING:      Results for orders placed or performed in visit on 11/16/19   AMB POC URINALYSIS DIP STICK AUTO W/O MICRO   Result Value Ref Range    Color (UA POC) Yellow     Clarity (UA POC) Clear     Glucose (UA POC) Negative Negative    Bilirubin (UA POC) Negative Negative    Ketones (UA POC) Negative Negative     Specific gravity (UA POC) 1.015 1.001 - 1.035    Blood (UA POC) Negative Negative    pH (UA POC) 6.5 4.6 - 8.0    Protein (UA POC) Negative Negative    Urobilinogen (UA POC) 0.2 mg/dL 0.2 - 1    Nitrites (UA POC) Negative Negative    Leukocyte esterase (UA POC) 2+ Negative     Prior labs or imaging:  Urine Culture 09/20/17  Mixed urogenital flora   Less than 10,000 colonies/mL     Urine culture 08/09/2017:  Proteus mirabilis, greater than 100,000 cfu/mL  AMIKACIN ?? ?? ?? ?? ?? ?? ?? ?? ?? ?? ?? ?? ?? ?? ?? ?? ?? ?? ?? ?? ?? ?? ?? S   AMPICILLIN ?? ?? ?? ?? ?? ?? ?? ?? ?? ?? ?? ?? ?? ?? ?? ?? ?? ?? ?? ?? ?? ?? S   AUGMENTIN ?? ?? ?? ?? ?? ?? ?? ?? ?? ?? ?? ?? ?? ?? ?? ?? ?? ?? ?? ?? ?? ?? ??S   BACTRIM (SULFA/TRIMETHPRM) ?? ?? ?? ?? ?? ?? ?? ?? ?? ?? ?? ?? ?? ?? S   CEFAZOLIN (  ANCEF) ?? ?? ?? ?? ?? ?? ?? ?? ?? ?? ?? ?? ?? ?? ?? ?? ?? ?? ??R   CEFTAZIDIME ?? ?? ?? ?? ?? ?? ?? ?? ?? ?? ?? ?? ?? ?? ?? ?? ?? ?? ?? ?? ?? ??S   CIPRO ?? ?? ?? ?? ?? ?? ?? ?? ?? ?? ?? ?? ?? ?? ?? ?? ?? ?? ?? ?? ?? ?? ?? ?? ??S   GENTAMICIN ?? ?? ?? ?? ?? ?? ?? ?? ?? ?? ?? ?? ?? ?? ?? ?? ?? ?? ?? ?? ?? ?? S   LEVAQUIN ?? ?? ?? ?? ?? ?? ?? ?? ?? ?? ?? ?? ?? ?? ?? ?? ?? ?? ?? ?? ?? ?? ?? S   MACRODANTN (NITROFURANTOIN ?? ?? ?? ?? ?? ?? ?? ?? ?? ?? ?? ?? ?? ?? R   ROCEPHIN (CEFTRIAXONE) ?? ?? ?? ?? ?? ?? ?? ?? ?? ?? ?? ?? ?? ?? ?? ?? S   TETRACYCLINE ?? ?? ?? ?? ?? ?? ?? ?? ?? ?? ?? ?? ?? ?? ?? ?? ?? ?? ?? ?? ?? R   TOBRAMYCIN ?? ?? ?? ?? ?? ?? ?? ?? ?? ?? ?? ?? ?? ?? ?? ?? ?? ?? ?? ?? ?? ?? S   ZOSYN (TAZOBACTAM/PIP) ?? ?? ?? ?? ?? ?? ?? ?? ?? ?? ?? ?? ?? ?? ?? ?? S     Urine culture 08/14/17:  Proteus mirabilis   Greater than 100,000 colony forming units per mL   Ampicillin ($) Susceptible S   Cefazolin ($) Susceptible S   Cefepime ($$) Susceptible S   Ceftriaxone ($) Susceptible S   Cefuroxime ($) Susceptible S   Ciprofloxacin ($) Susceptible S   Ertapenem ($$$$) Susceptible S   Gentamicin ($) Susceptible S   Levofloxacin ($) Susceptible S   Meropenem ($$) Resistant R   Nitrofurantoin Resistant R   Piperacillin/Tazobac ($) Susceptible S   Tetracycline Resistant R   Tobramycin ($) Susceptible S   Trimeth-Sulfamethoxa Susceptible S         Urine culture 07/19/17: (not treated due to low count and asymptomatic)   COLONY COUNT / RESULT:   20,000 cfu/ml     RESULT/ORG ID:   Proteus mirabilis (Isolate 1)     Sensitivity Analysis ?? ?? ?? ?? ?? ?? ?? ?? ?? ?? ?? ?? ?? Isolate 1   -------------------- ?? ?? ?? ?? ?? ?? ?? ?? ?? ?? ?? ?? ?? ---------     AMIKACIN ?? ?? ?? ?? ?? ?? ?? ?? ?? ?? ?? ?? ?? ?? ?? ?? ?? ?? ?? ?? ?? ?? ?? S   AMPICILLIN ?? ?? ?? ?? ?? ?? ?? ?? ?? ?? ?? ?? ?? ?? ?? ?? ?? ?? ?? ?? ?? ?? S   AUGMENTIN ?? ?? ?? ?? ?? ?? ?? ?? ?? ?? ?? ?? ?? ?? ?? ?? ?? ?? ?? ?? ?? ?? ??S   BACTRIM (SULFA/TRIMETHPRM) ?? ?? ?? ?? ?? ?? ?? ?? ?? ?? ?? ?? ?? ?? S   CEFAZOLIN (ANCEF) ?? ?? ?? ?? ?? ?? ?? ?? ?? ?? ?? ?? ?? ?? ?? ?? ?? ?? ??R   CEFTAZIDIME ?? ?? ?? ?? ?? ?? ?? ?? ?? ?? ?? ?? ?? ?? ?? ?? ?? ?? ?? ?? ?? ??S   CIPRO ?? ?? ?? ?? ?? ?? ?? ?? ?? ?? ?? ?? ?? ?? ?? ?? ?? ?? ?? ?? ?? ?? ?? ?? ??S  GENTAMICIN ?? ?? ?? ?? ?? ?? ?? ?? ?? ?? ?? ?? ?? ?? ?? ?? ?? ?? ?? ?? ?? ?? S   LEVAQUIN ?? ?? ?? ?? ?? ?? ?? ?? ?? ?? ?? ?? ?? ?? ?? ?? ?? ?? ?? ?? ?? ?? ?? S   MACRODANTN (NITROFURANTOIN ?? ?? ?? ?? ?? ?? ?? ?? ?? ?? ?? ?? ?? ?? R   ROCEPHIN (CEFTRIAXONE) ?? ?? ?? ?? ?? ?? ?? ?? ?? ?? ?? ?? ?? ?? ?? ?? S   TETRACYCLINE ?? ?? ?? ?? ?? ?? ?? ?? ?? ?? ?? ?? ?? ?? ?? ?? ?? ?? ?? ?? ?? R   TOBRAMYCIN ?? ?? ?? ?? ?? ?? ?????? ?? ?? ?? ?? ?? ?? ?? ?? ?? ?? ?? ?? ?? ?? S   ZOSYN (TAZOBACTAM/PIP) ?? ?? ?? ?? ?? ?? ?? ?? ?? ?? ?? ?? ?? ?? ?? ?? S     Urine culture 06/03/2017 - Mixed Culture       A copy of today's office visit with all pertinent imaging results and labs were sent to the referring physician.      Briant Sites, FNP

## 2021-09-08 LAB — COMPREHENSIVE METABOLIC PANEL

## 2021-09-08 LAB — VITAMIN D, 25 HYDROXY: Vit D, 25-Hydroxy: 56 ng/ml (ref 30.0–100.0)

## 2022-01-15 IMAGING — MR MRI LUMBAR SPINE WITHOUT CONTRAST
4 of 6 series · 15 of 48 positions shown · IV contrast (gadolinium)
Comparison: None

________________________________________________________________________________________________ 
MRI LUMBAR SPINE WITHOUT CONTRAST, 01/15/2022 [DATE]: 
CLINICAL INDICATION: Left sciatica, facet arthropathy, right knee surgery 8 
months ago, left hip pain extending to left thigh
TECHNIQUE: Sagittal T1, Sagittal T2, Sagittal STIR, Axial T1 and Axial T2 MR 
images of the lumbar spine were performed without intravenous gadolinium 
enhancement.

[Series 101: survey · axial · 10.0mm · 1.39mm/px · z∈[-15,+199]mm · 5 of 9 slices shown]
[im 1/9]
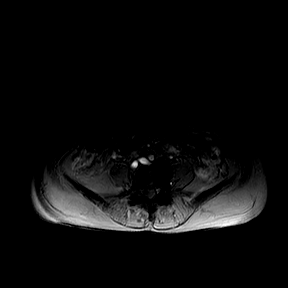
[im 3/9]
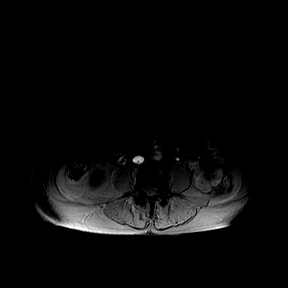
[im 5/9]
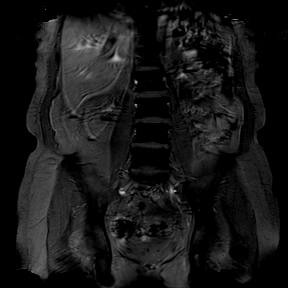
[im 7/9]
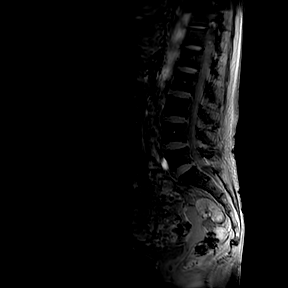
[im 9/9]
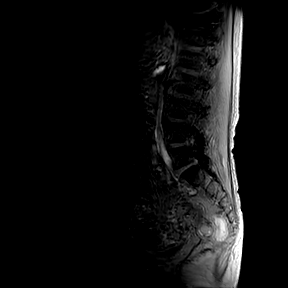

[Series 201: t2w_cor-surv · coronal · 6.0mm · 0.50mm/px · 3 of 5 slices shown]
[im 1/5]
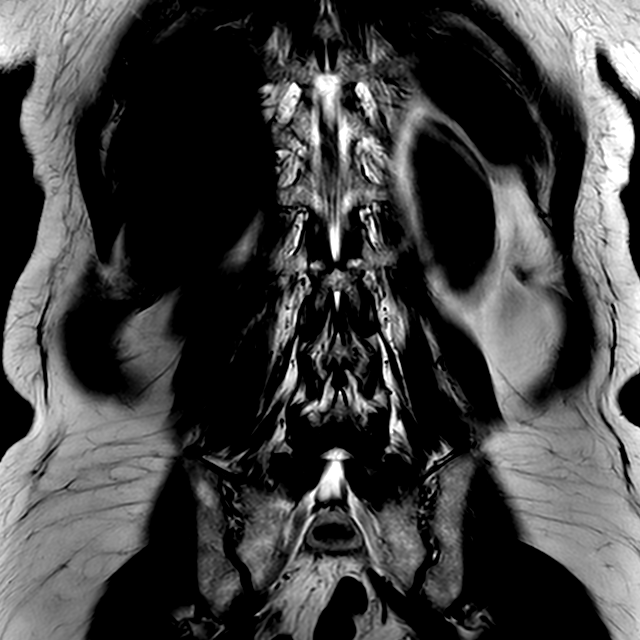
[im 3/5]
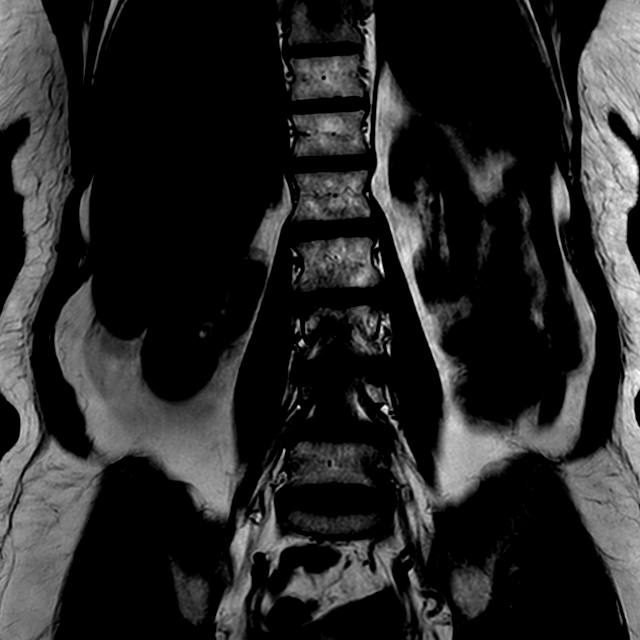
[im 5/5]
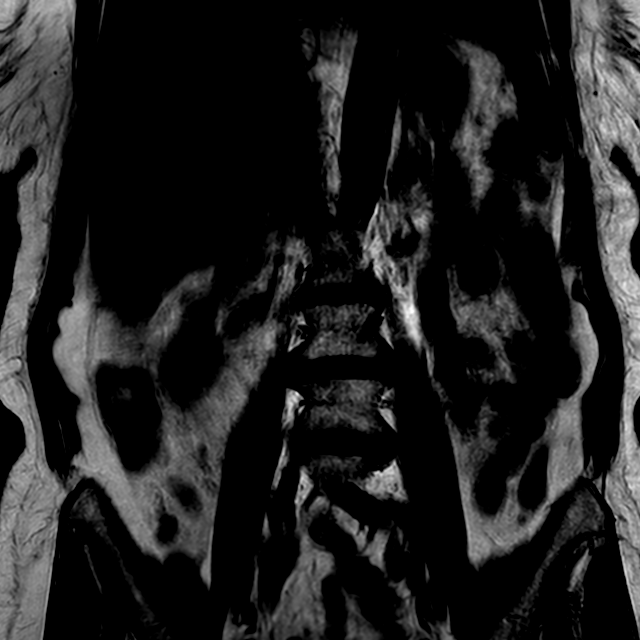

[Series 301: t2w_tse sag · sagittal · 4.0mm · 0.35mm/px · 4 of 17 slices shown]
[im 1/17]
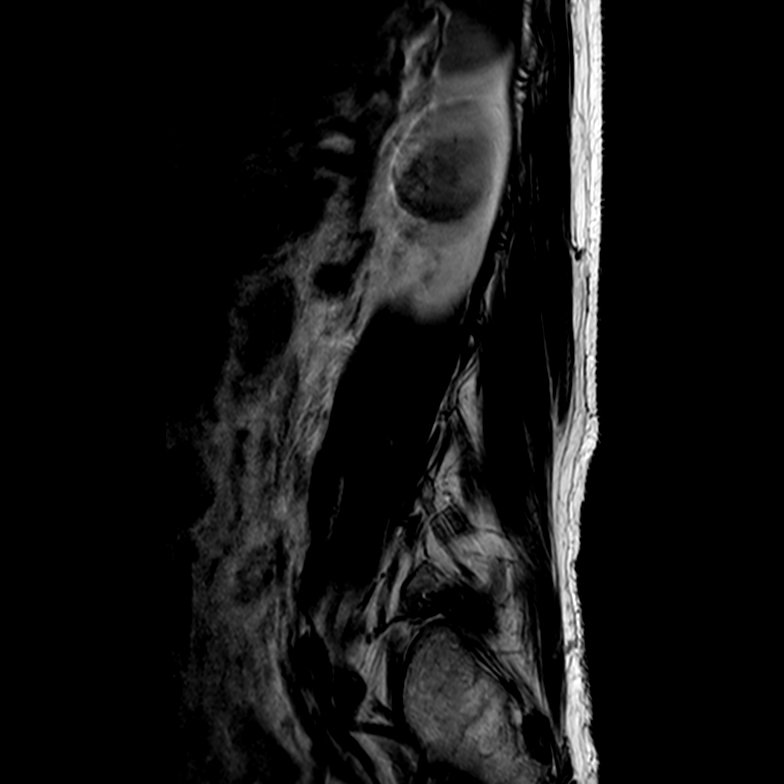
[im 3/17]
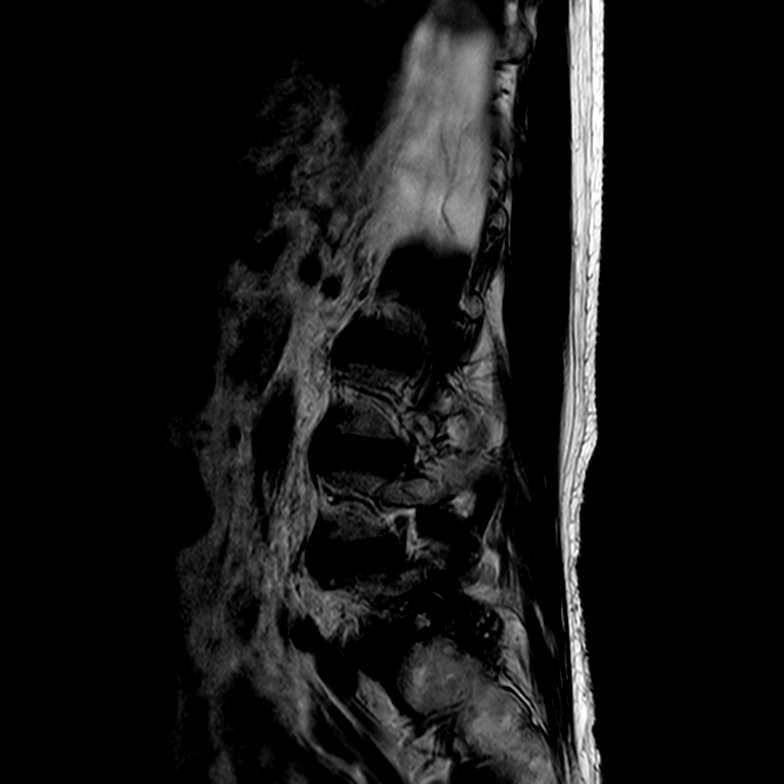
[im 10/17]
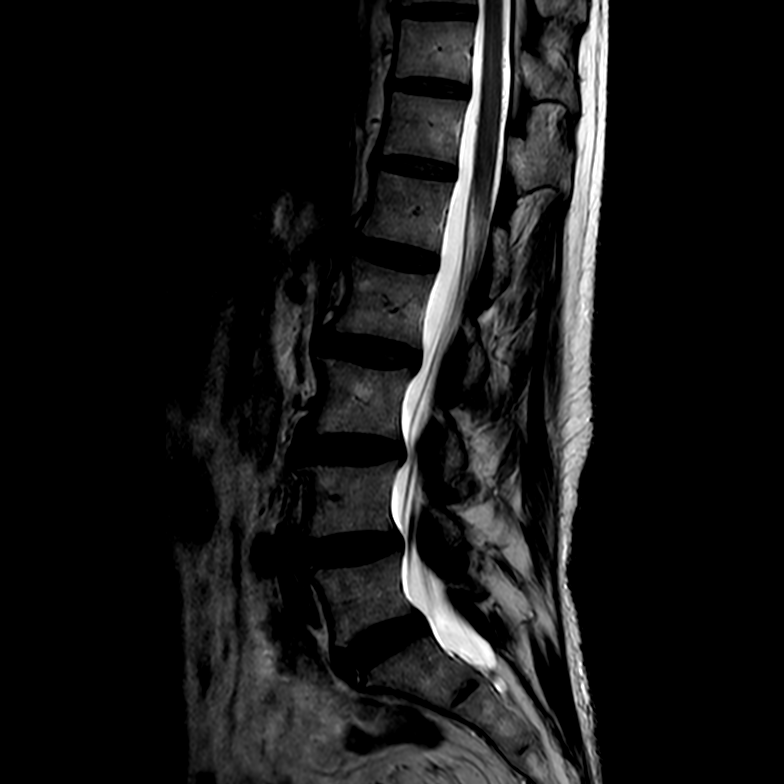
[im 14/17]
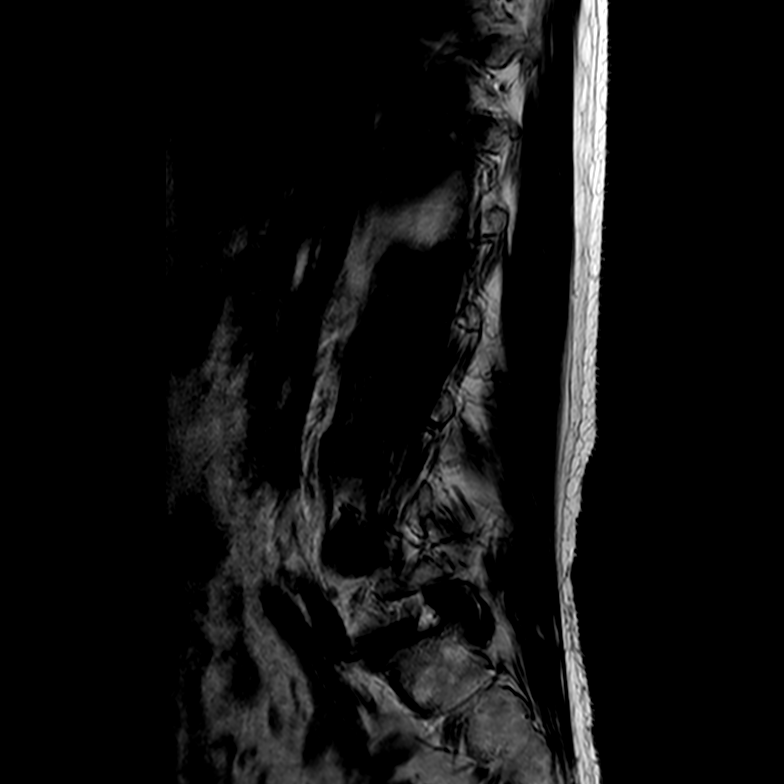

[Series 401: t1w_tse sag · sagittal · 4.0mm · 0.54mm/px · 3 of 17 slices shown]
[im 3/17]
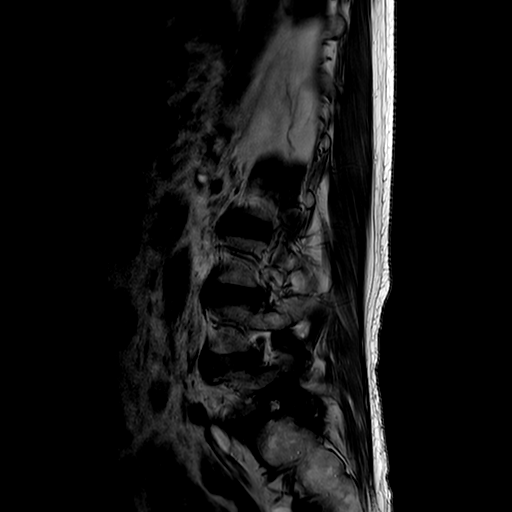
[im 10/17]
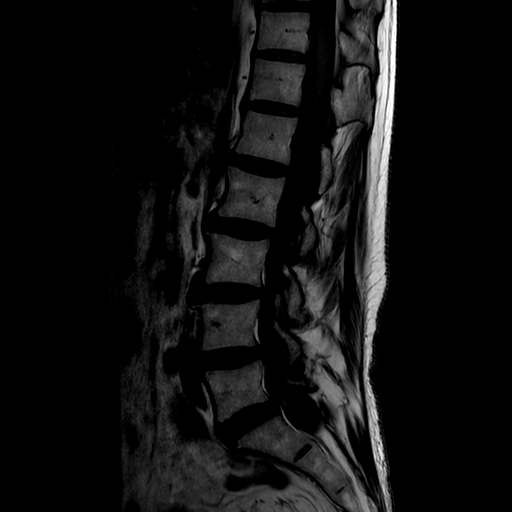
[im 14/17]
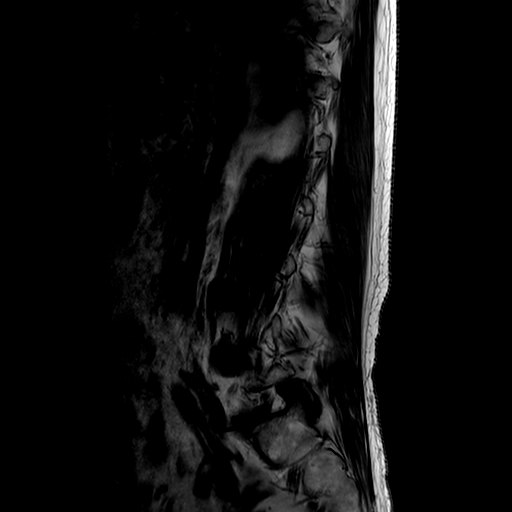

[15 of 48 positions shown; findings below may reference images not displayed]

FINDINGS: Lumbar vertebral heights are intact. There is 2 mm anterolisthesis of 
L4 and L5. No pars defect.. There is mild lumbar levocurvature. No findings to 
indicate spinal malignancy. Conus shows normal signal, terminating opposite 
T12-L1. 
At L5-S1 there is a left synovial cyst producing deformity of the proximal left 
S1 nerve root within its lateral recess. The cyst measures 7 x 4 mm, axial T2 
image 2. In addition there is moderate facet change with ligamentous thickening 
and minimal disc bulge. There is a small right-sided synovial cyst, 4 x 2 mm 
encroaching on the right S1 root sleeve. These findings are best seen on axial 
T2 images [DATE]. There is moderate left foraminal stenosis. Right foramen open. 
At L4-5 there is mild-moderate canal stenosis due to disc bulge and moderate 
facet and ligamentous hypertrophy, best seen on axial image 8. Right foramen is 
open. The left foramen is open although there is a cyst encroaching on the 
exiting left L4 nerve root described below. 
At L3-4 there is mild canal stenosis. There is a 6 mm cyst medial to the left L4 
pedicle, encroaching on the left L4 nerve root as it extends from the lateral 
recess into the neural foramen. This is most likely a perineural root sleeve 
cyst, possibly unusual synovial cyst. The L3-4 foramina remain open. 
At L2-3 and L1-2 the canal and foramina are open. 
On axial T2 image 1 there is a potential 2.2 cm cystic focus interposed between 
the psoas and iliacus  muscle junction. This is of uncertain nature. CT the 
pelvis would be useful to further evaluate this finding.
IMPRESSION: Mild to moderate canal stenosis at L4-5 encroaching on the exiting L5 nerve 
roots, somewhat worse on the left. 2 mm anterolisthesis at L4-5. 
7 mm Synovial cyst at L5-S1, larger on the left, impinging the left S1 nerve 
root as it exits the dural tube. 
6 mm cyst medial to the left L4 pedicle and encroaches on the left L4 nerve root 
as it exits the lateral recess. 
No evidence for fracture or spinal malignancy. Note made of a 2.2 cm potential 
cyst in the deep right retroperitoneum between the psoas and iliacus muscles. 
This is of uncertain nature, seen on only one image. CT of the pelvis would be 
useful for further evaluation. 
Mild lumbar levocurvature.

## 2022-02-05 IMAGING — CT CT PELVIS WITH CONTRAST
2 of 3 series · 16 of 46 positions shown, 18 images · IV contrast (APPLIED)
Comparison: Comparison was made to the prior exam(s) within the last 12 months 
MRI 01/15/2022.

________________________________________________________________________________________________ 
CT PELVIS WITH CONTRAST, 02/05/2022 [DATE]: 
CLINICAL INDICATION: Abnormal MRI questioning a retroperitoneal cystic 
structure. 
A search for DICOM formatted images was conducted for prior CT imaging studies 
completed at a non-affiliated media free facility.
TECHNIQUE: The pelvis was scanned from lower abdomen through the pubic rami 
with 100 mL of Isovue 300  injected intravenously on a high-resolution CT 
scanner using dose reduction techniques.  Routine MPR reconstructions were 
performed. The patients eGFR was calculated to be 53.6 mL/min/1.73 m2 using the 
i-STAT device.

[Series 4: abd/pel ax w · axial · 0.77mm/px · z∈[-310,-48]mm · 13 of 101 slices shown, 15 images]
[im 7/101  soft-tissue]
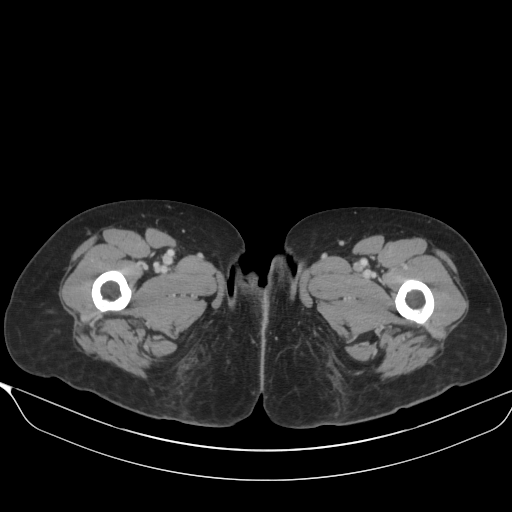
[im 7/101  bone]
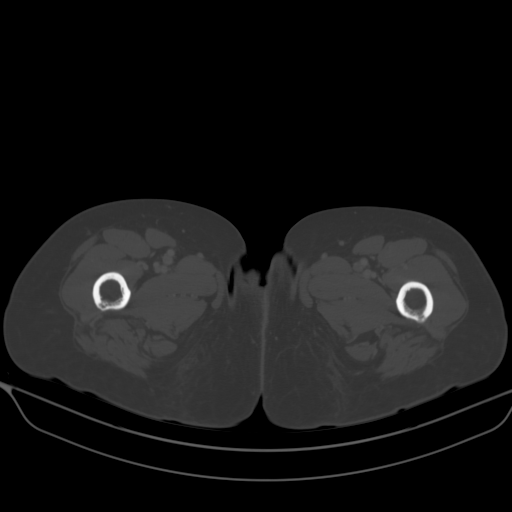
[im 13/101  soft-tissue]
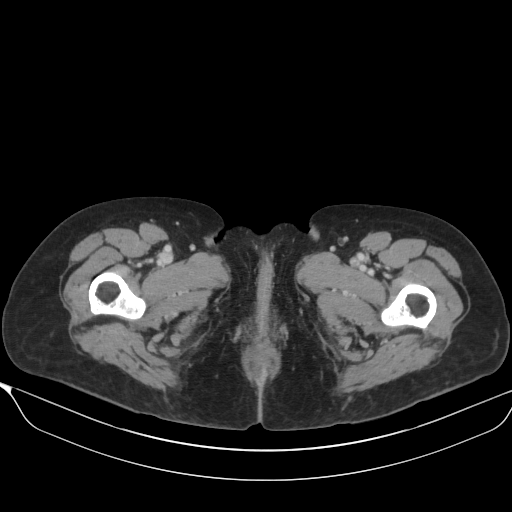
[im 20/101  soft-tissue]
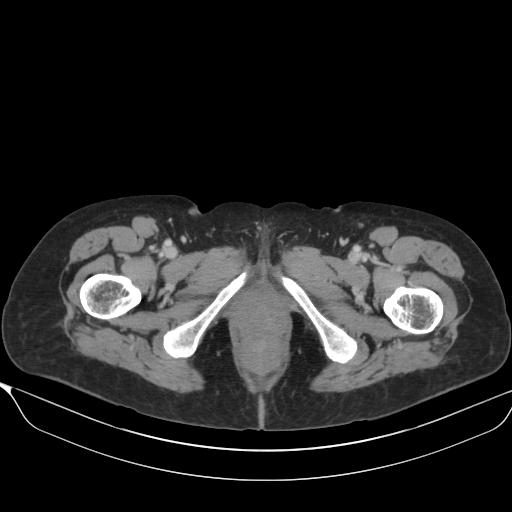
[im 30/101  soft-tissue]
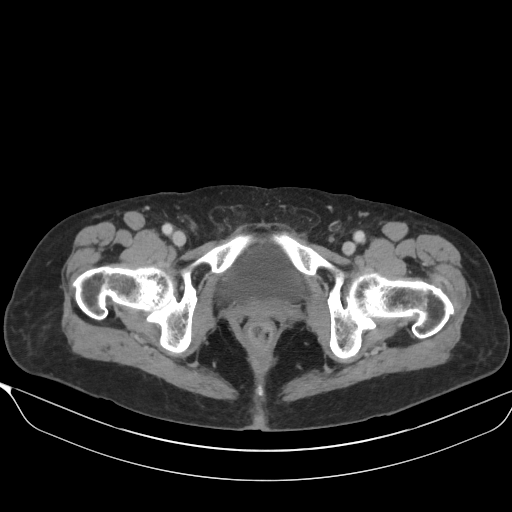
[im 36/101  soft-tissue]
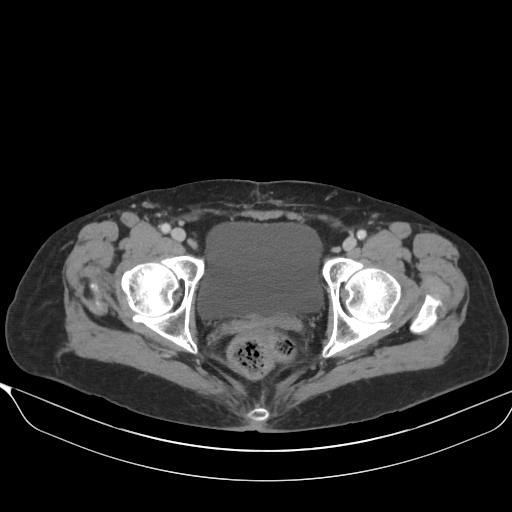
[im 42/101  soft-tissue]
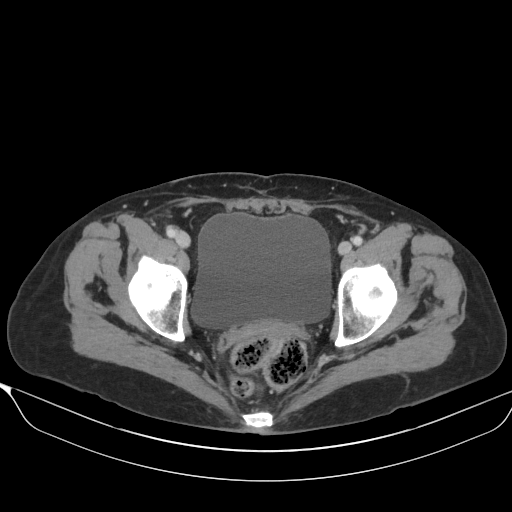
[im 52/101  soft-tissue]
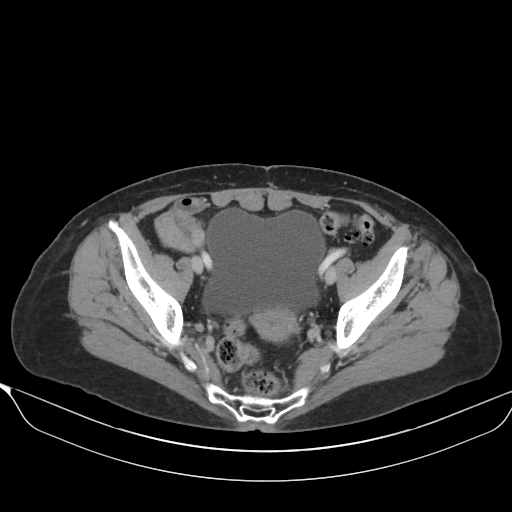
[im 59/101  soft-tissue]
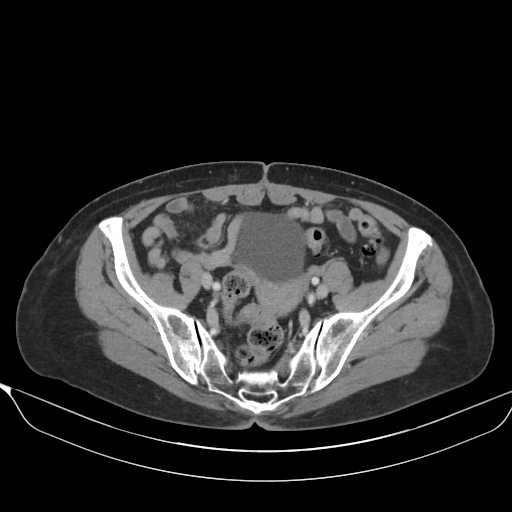
[im 65/101  soft-tissue]
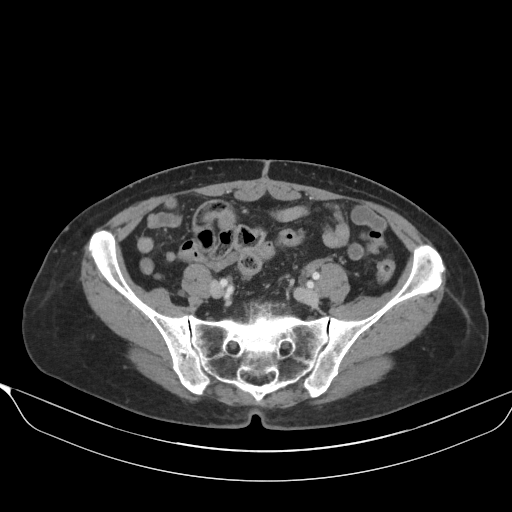
[im 65/101  bone]
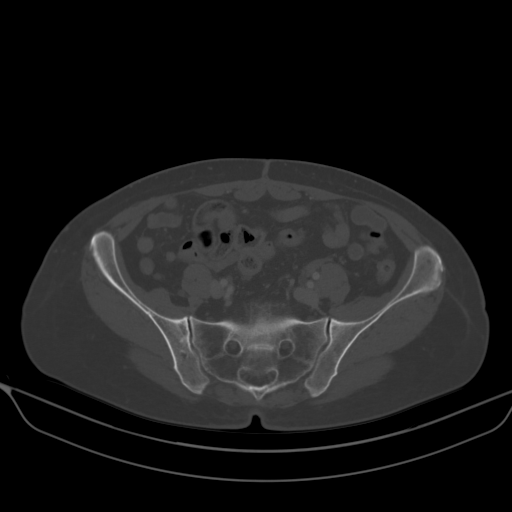
[im 71/101  soft-tissue]
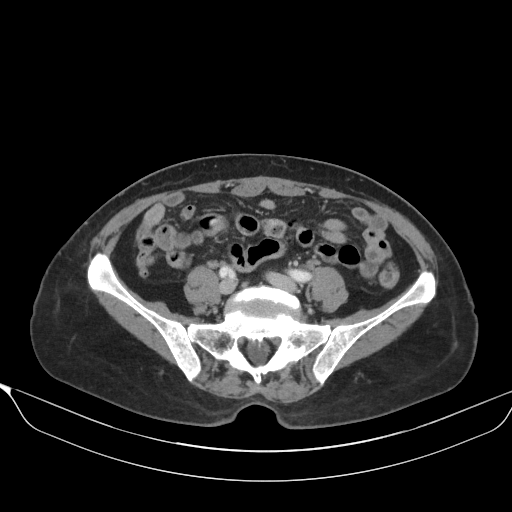
[im 81/101  soft-tissue]
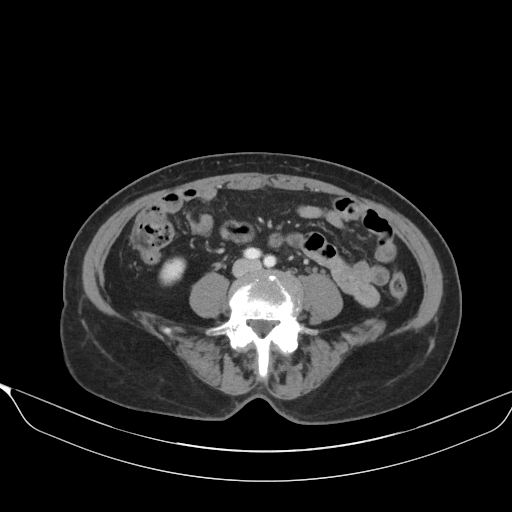
[im 88/101  soft-tissue]
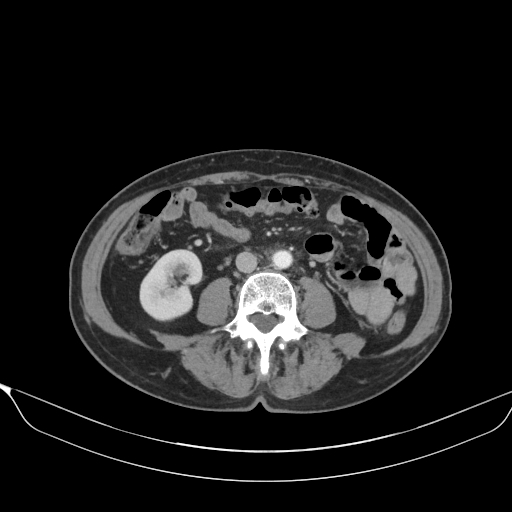
[im 94/101  soft-tissue]
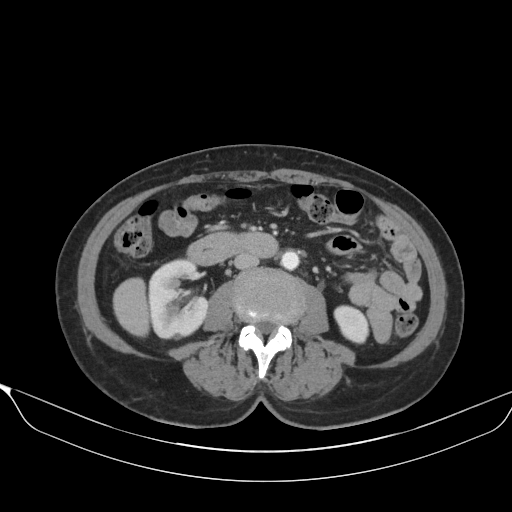

[Series 5: pel cor w · coronal · 0.58mm/px · 3 of 115 slices shown]
[im 39/115  soft-tissue]
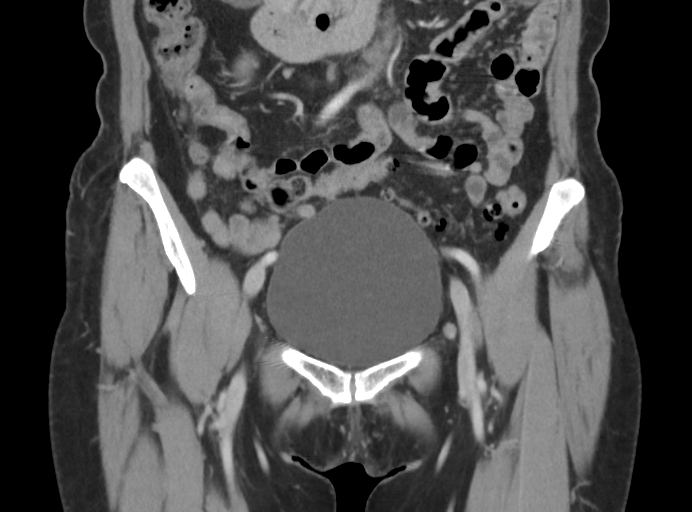
[im 51/115  soft-tissue]
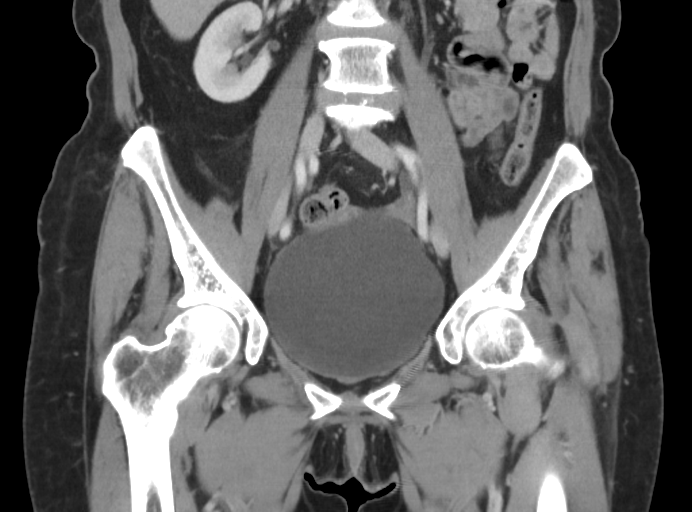
[im 64/115  soft-tissue]
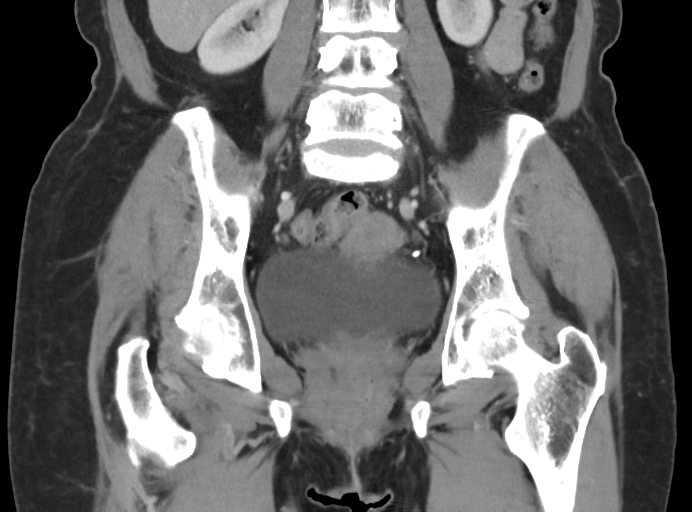

[16 of 46 positions shown; findings below may reference images not displayed]

FINDINGS: Atherosclerotic changes and degenerative changes. Uterus and adnexa are normal 
in appearance. No abnormal mass seen correlating to the MR abnormality. This 
most likely was volume averaging at the time.
IMPRESSION: No suspicious retroperitoneal lesion identified. 
RADIATION DOSE REDUCTION: All CT scans are performed using radiation dose 
reduction techniques, when applicable.  Technical factors are evaluated and 
adjusted to ensure appropriate moderation of exposure.  Automated dose 
management technology is applied to adjust the radiation doses to minimize 
exposure while achieving diagnostic quality images.

## 2022-05-11 ENCOUNTER — Ambulatory Visit: Admit: 2022-05-11 | Payer: MEDICARE | Attending: Family

## 2022-05-11 DIAGNOSIS — N390 Urinary tract infection, site not specified: Secondary | ICD-10-CM

## 2022-05-11 LAB — AMB POC URINALYSIS DIP STICK AUTO W/O MICRO
Bilirubin, Urine, POC: NEGATIVE
Blood, Urine, POC: NEGATIVE
Glucose, Urine, POC: NEGATIVE
Ketones, Urine, POC: NEGATIVE
Nitrite, Urine, POC: POSITIVE
Specific Gravity, Urine, POC: 1.015 (ref 1.001–1.035)
Urobilinogen, POC: 0.2
pH, Urine, POC: 7 (ref 4.6–8.0)

## 2022-05-11 LAB — AMB POC PVR, MEAS,POST-VOID RES,US,NON-IMAGING: PVR, POC: 12 cc

## 2022-05-11 MED ORDER — ESTRADIOL 0.1 MG/GM VA CREA
0.1 MG/GM | VAGINAL | 3 refills | Status: AC
Start: 2022-05-11 — End: ?

## 2022-05-11 NOTE — Progress Notes (Signed)
Dr. Noreene Larsson is my Supervising provider today.      05/11/2022       ASSESSMENT:      Diagnosis Orders   1. Recurrent UTI  AMB POC PVR, MEAS,POST-VOID RES,US,NON-IMAGING    AMB POC URINALYSIS DIP STICK AUTO W/O MICRO        PLAN:    1. Recurrent UTI  Recurrent UTIs are probably multifactorial and stem from both host and bacterial factors, including but not limited to poor bladder drainage with significant debris in the bladder allowing for colonization with bacteria; incontinence into diapers leading to increased bacterial load on the skin and perineum; atrophy of the vaginal/introital mucosa from lack of estrogenization post-menopausally, or impaired immunity related to chronic medical conditions and/or age.   We discussed recurrent UTIs and potential causes, including failure to eradicate bacteria vs reinfection. Will try to 1) treat any current infection 2) prevent future infections and 3) potentially determine any correctable causes for infections (via imaging).     Today's urine shows evidence of infection.  Urine culture sent will notify results  Start Estrace vaginal cream 3 times weekly; R/B and SE discussed with patient. Rx sent to pharmacy.  Recommend OTC d-mannose, cranberry tablets and probiotic  UTI prevention checklist given to patient  We will restart PPx abx based on UCx    For the non-index patient, we discussed potentially imaging of upper tract (RUS) and potential cystoscopy if risk factors for bladder pathology exist.   We discussed the limited role for prophylactic low dose antibiotics for a period of time. We discussed the role of estrogen cream to the introitus to improve vaginal tissue health.       RTC 3 months; sooner if difficulty  All questions answered.    A copy of today's note will be sent to the patient's primary care physician, Burnice Logan, MD Thank you for the opportunity to participate in the care of this nice patient.    Body mass index is 22.3 kg/m. Patient's BMI  is out of the normal parameters.  Information about BMI was given to the patient.     SUBJECTIVE:     CC:   Chief Complaint   Patient presents with    Frequent/Recurrent UTI     Pt. Is present to evaluate recurrent UTI , pt. Stated that she was referred here by her PCP , pt. Stated that she not having any symptoms at this time , pt. Stated that she used be on a PPX , pt. Stated that she usually gets them after intercourse , pt. Stated that she just finished the abx cipro for 5 days given by PCP , pt. Stated that she is taking probiotics and uqora ,         HPI:    Michelle Snyder is a 81 y.o. female  who presents today for  in follow up for recurrent UTI's. Last seen by JMT on 11/16/19. Previously on post coital ppx Macrobid per JMT.    Patient states husband became ill in 2020 which reduce her sexual activity significantly.  UTIs resolved.  Husband passed away 2 years ago.  She is now met a new sexual partner with recurrence of UTIs. Recently treated for UTI with Cipro by PCP. Reports 1 over the last year.  Currently on uquora contains probiotic and d-mannose.  Denies any voiding difficulties or any other bothersome Neri complaints. Asymptomatic for infection today. Denies flank pain, dysuria, hematuria. No f/c/n/v.  PRIOR HX:  11/16/19 per JMT: She has been doing well in the interim. She reported no UTI's x 1 year. Drinking well. Voiding well, no urgency, no frequency. No f/c/n/v.      11/10/2018: She has been doing very well in the interim and has no complaints today.  No breakthrough infections in the interim.   She continued on post coital nitrofurantoin although she admits to decreased frequency as her husband has been sick.  No dysuria, gross hematuria, f/c/n/v.       04/25/2018: Patient had repeat Proteus mirabilis UTI on 08/14/2017 and subsequently began po Amoxicillin 500 mg BID x5 days.  She was later treated for Proteus mirabilis UTI on 08/29/2017 with [facility] bladder instillations 160 mg daily x5 days  in [place]. Repeat urine culture was unremarkable on 09/20/2017.   SHe has been doing well since her last visit.          07/12/2017:   Recently had oral surgery and completed a course of Clindamycin. After completing the Clindamycin she reports she developed UTI symptoms, increased dysuria, frequency, urgency. Took  a course of Augmentin she had at home. A few days later developed severe diarrhea. Went to the ER and was found to have C. Difficile. Treated with vancomycin. Diarrhea later returned and was put back on Vancomycin by her PCP, has one week left. Diarrhea  has improved. Took Culturelle, but stopped Now on Saccharomyces boulardii + MOS with benefit. Leaving in late August for a cruise.      01/07/17   presents today in follow up for recurrent UTI. Patient has done well over the interim, no recent infections and currently asymptomatic. Taking Macrobid post coitally without any issues. Coffee and carbonated drinks serve as a trigger for dysuria. Has  minor episode of right 'pinching' sensation, ?UTI however symptoms resolved shortly after a BM. States she is due to have a prolia injection today and believes this may be trigger for her infections. ?Microhematuria presence being a precursor for UTI's.   Denies flank pain, gross hematuria and dysuria. No f/c/n/v.       Has previously been treated with Cipro, Augmentin, Nitrofurantoin, Macrobid - admits UTI associated symptoms did not resolved with course of Cipro.       12/30/15: She denies any infection since we last met in May 2016. She continues on Macrobid 100 mg post coitus.  She had a negative hematuria  work up in 2015.  No issues at this time. She denies any urinary frequency, urgency, incomplete bladder emptying.  She is asymptomatic for UTI today. No dysuria, gross hematuria. No f/c/n/v.       5//20/16: She presents today for follow up and management of recurrent infections.    Patient states that she recently finished Cipro and has not experiencing  and symptoms associated with an infection. No current symptoms. Denies any dysuria, hematuria, or voiding  symptoms.    No f/n/c/v.       01/2014 who presents today with complaint of dysuria, frequency, urgency cloudy urine, and pelvic pressure that resolved 1 week ago; these  have resolved now. In October, Dr. Georgann Housekeeper performed UA, provided patient Macrobid 100 mg to use post-coitally. She had it on hand while on a cruise in December; used Macrobid 12/01/14 - 12/08/13. She continued with symptoms until 01/16/14. She  uses macrobid postcoitally.       01/21/14 Prior history obtained on 01/21/14: for reevaluation. She reports that she felt she had a UTI just prior  to Christmas. UA and culture  were negative. She reports the passage of small amts of sand. Reports RLQ pain which increases with caffeine. Reports it is an ache. Locates it to right suprapubic pain.  Occ vaginal spotting and using vaginal estrogen. Denies SUI.     OBJECTIVE:   Physical Exam    Resp 16   Ht '5\' 5"'$  (1.651 m)   Wt 134 lb (60.8 kg)   BMI 22.30 kg/m     General: WNWD, well-appearing female in NAD      Review of Labs, Medical Images, tracings or specimens:    No results found for this visit on 05/11/22.    Ucx trend  05/04/2022 <10K bacteria  04/18/2022 > 100 K mixed flora        Rasheedah Reis, NP-C  [facility]  A Division of [facility] [place]    Any elements of the PMH, FH, SHx, ROS, or preliminary elements of the HPI that were entered by a medical assistant have been reviewed in full.

## 2022-05-11 NOTE — Progress Notes (Deleted)
Dr. Fran Lowes is my Supervising provider today.      ASSESSMENT:   {No diagnosis found. (Refresh or delete this SmartLink)}            PLAN:    Urine culture ordered.  We will notify patient of results and treat accordingly.                   Urinary Tract Infection     HISTORY OF PRESENT ILLNESS:  Michelle Snyder is a 81 y.o. female who presents today for initial evaluation of urinary tract infection.  Michelle Snyder notes symptoms of              Past Medical History:   Diagnosis Date    Anemia     Arthritis     Cystocele     Cystocele, unspecified     Disease of thyroid gland     High cholesterol     Hyperlipidemia     Ocular hypertension     Ocular hypertension     PVD (posterior vitreous detachment)     Recurrent UTI     Urinary frequency     Urinary incontinence     UTI (urinary tract infection)        Past Surgical History:   Procedure Laterality Date    OTHER SURGICAL HISTORY  2005    PLASTIC SURGERY    OTHER SURGICAL HISTORY  1971    FINE NEEDLE ASPIRATE, CYTOLOGY    OTHER SURGICAL HISTORY  12/20/2008    CYSTOCELE REPAIR SLING POST PROCEDURE ORDERSET 517    POLYPECTOMY  2007    TOTAL KNEE ARTHROPLASTY  2/20110    partial     UROLOGICAL SURGERY  01/2013    bladder sling       Social History     Tobacco Use    Smoking status: Former    Smokeless tobacco: Never   Substance Use Topics    Alcohol use: Yes    Drug use: No       Allergies   Allergen Reactions    Alendronate Itching, Other (See Comments) and Shortness Of Breath    Clindamycin Other (See Comments)    Sulfa Antibiotics Other (See Comments)     50 yrs ago    Sulfasalazine Other (See Comments)     Other reaction(s): Dermatological problems, e.g., rash, hives       Family History   Problem Relation Age of Onset    Heart Disease Mother     Hypertension Mother     Heart Disease Father     Hypertension Father     Breast Cancer Sister     Diabetes Paternal Grandfather     Lymphoma Daughter     Breast Cancer Maternal Aunt        Current Outpatient Medications    Medication Sig Dispense Refill    aspirin 81 MG EC tablet Take by mouth daily      atorvastatin (LIPITOR) 10 MG tablet ceived the following from [facility]: Outside name: atorvastatin (LIPITOR) 10 mg tablet      Cholecalciferol 50 MCG (2000 UT) TABS Take by mouth      denosumab (PROLIA) 60 MG/ML SOSY SC injection Inject 60 mg into the skin      levothyroxine (SYNTHROID) 175 MCG tablet 0.075 mg      naproxen sodium (ANAPROX) 220 MG tablet Take 220 mg by mouth 2 times daily (with meals)  nitrofurantoin, macrocrystal-monohydrate, (MACROBID) 100 MG capsule Take 100 mg by mouth      raloxifene (EVISTA) 60 MG tablet Take by mouth daily       No current facility-administered medications for this visit.           PHYSICAL EXAMINATION:     There were no vitals taken for this visit.  Constitutional: Well developed, well-nourished female in no acute distress.   CV:  No peripheral swelling noted  Respiratory: No respiratory distress or difficulties  Abdomen:  Soft and nontender. No masses. No hepatosplenomegaly.   GU:  deferred   Skin:  Normal color. No evidence of jaundice.     Neuro/Psych:  Patient with appropriate affect.  Alert and oriented.    Lymphatic:   No enlargement of supraclavicular lymph nodes.      REVIEW OF LABS AND IMAGING:      No results found for this visit on 05/11/22.           A copy of today's office visit with all pertinent imaging results and labs were sent to the referring physician, Dr.       Erie Noe, APRN - NP on 05/11/2022

## 2022-05-13 LAB — URINE C&S

## 2022-05-15 MED ORDER — CEPHALEXIN 500 MG PO CAPS
500 MG | ORAL_CAPSULE | Freq: Two times a day (BID) | ORAL | 0 refills | Status: AC
Start: 2022-05-15 — End: 2022-05-20

## 2022-05-15 MED ORDER — CEPHALEXIN 250 MG PO CAPS
250 MG | ORAL_CAPSULE | ORAL | 2 refills | Status: AC
Start: 2022-05-15 — End: ?

## 2022-05-15 NOTE — Telephone Encounter (Addendum)
Pt. Was called and she did answer. Pt. Was informed of results . Pt. Stated that she has been having symptoms such as frequency of urination . ABX below has been sent to [facility] on file . Pt. Also wanted to know if she can start the PPX . Pt. Was informed that I relay this message to MKP . Pt. Thanked me and then hung up .    ----- Message from Erie Noe, APRN - NP sent at 05/15/2022  1:50 PM EDT -----  If symptomatic, please send Keflex 500 mg BID x 5 days. Thank you.  Melissa

## 2022-05-15 NOTE — Progress Notes (Signed)
No additional comment

## 2022-05-15 NOTE — Other (Signed)
If symptomatic, please send Keflex 500 mg BID x 5 days. Thank you.  Michelle Snyder

## 2022-05-15 NOTE — Telephone Encounter (Signed)
Received call from patient asking for her urine culture result from 05/11/22, she would like to know re: abx as she is being scheduled for an epidural.    Her tel: [phone]

## 2022-05-15 NOTE — Telephone Encounter (Addendum)
Pt. Was called and she did answer . Pt. Was informed that PPX has been sent . Pt. Thanked me and then hung up .     ----- Message from Erie Noe, APRN - NP sent at 05/15/2022  4:08 PM EDT -----  Postcoital Keflex PPx sent to pharmacy  ----- Message -----  From: Sandria Senter, MA  Sent: 05/15/2022   2:18 PM EDT  To: Erie Noe, APRN - NP    Would you like me to send PPX ?     Pt. Was called and she did answer. Pt. Was informed of results . Pt. Stated that she has been having symptoms such as frequency of urination . ABX below has been [facility] to [facility] on file . Pt. Also wanted to know if she can start the PPX . Pt. Was informed that I relay this message to MKP . Pt. Thanked me and then hung up .    ----- Message -----  From: Erie Noe, APRN - NP  Sent: 05/15/2022   1:50 PM EDT  To: Sandria Senter, MA    If symptomatic, please send Keflex 500 mg BID x 5 days. Thank you.  Melissa

## 2022-05-25 NOTE — Telephone Encounter (Signed)
Pt called to follow up on request for records to be sent to her pain management doc.  Please see the pt message for further info. CBN [phone]

## 2022-08-15 ENCOUNTER — Encounter: Attending: Family

## 2022-08-16 ENCOUNTER — Encounter: Attending: Family

## 2024-01-13 IMAGING — MR MRI LUMBAR SPINE WITHOUT CONTRAST
5 of 8 series · 9 of 48 positions shown · IV contrast (gadolinium)
Comparison: CT pelvis February 05, 2022. MRI lumbar spine January 15, 2022.

________________________________________________________________________________________________ 
MRI LUMBAR SPINE WITHOUT CONTRAST, 01/13/2024 [DATE]: 
CLINICAL INDICATION: Other Spondylosis, Lumbar Region. Radiculopathy, Lumbar 
Region. Spinal Stenosis, Lumbar Region Without Neurogenic Claudication. 
Spondylolisthesis, Lumbar Region , low back pain with radiation down the left 
leg for 2 years. No trauma.
TECHNIQUE: Multiplanar, multiecho position MR images of the lumbar spine were 
performed without intravenous gadolinium enhancement. Patient was scanned on a 
3T magnet

[Series 101: survey · axial · 10.0mm · 1.39mm/px · 1 of 9 slices shown]
[im 1/9]
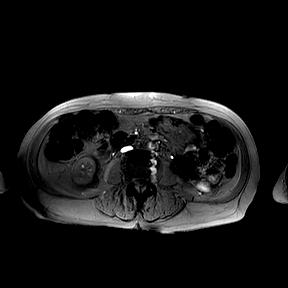

[Series 201: t2w_cor-surv · coronal · 6.0mm · 0.50mm/px · 1 of 5 slices shown]
[im 1/5]
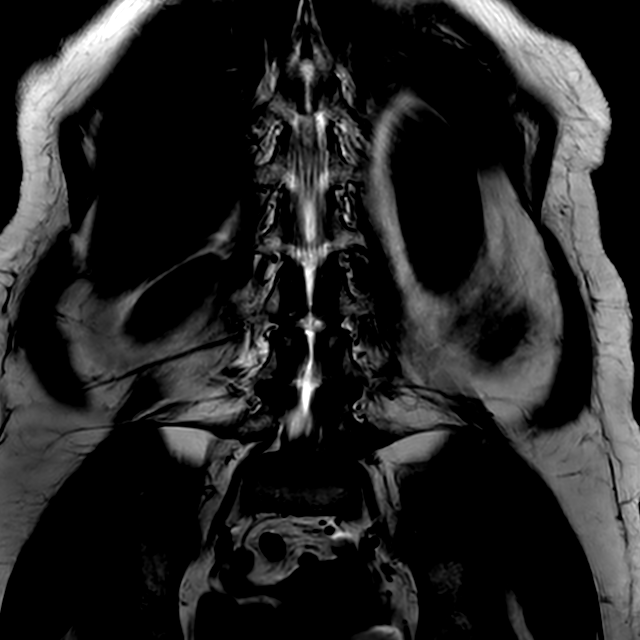

[Series 301: t1w_tse sag · sagittal · 4.0mm · 0.23mm/px · 2 of 17 slices shown]
[im 1/17]
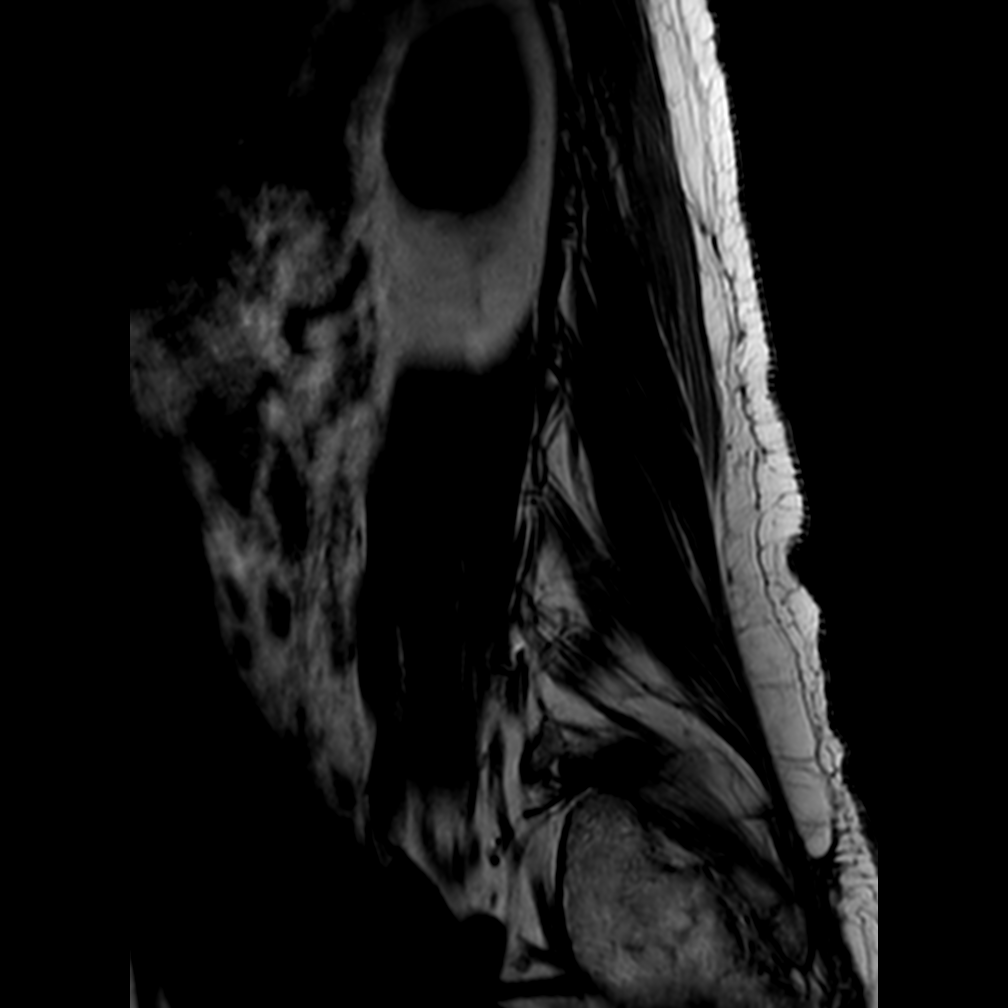
[im 17/17]
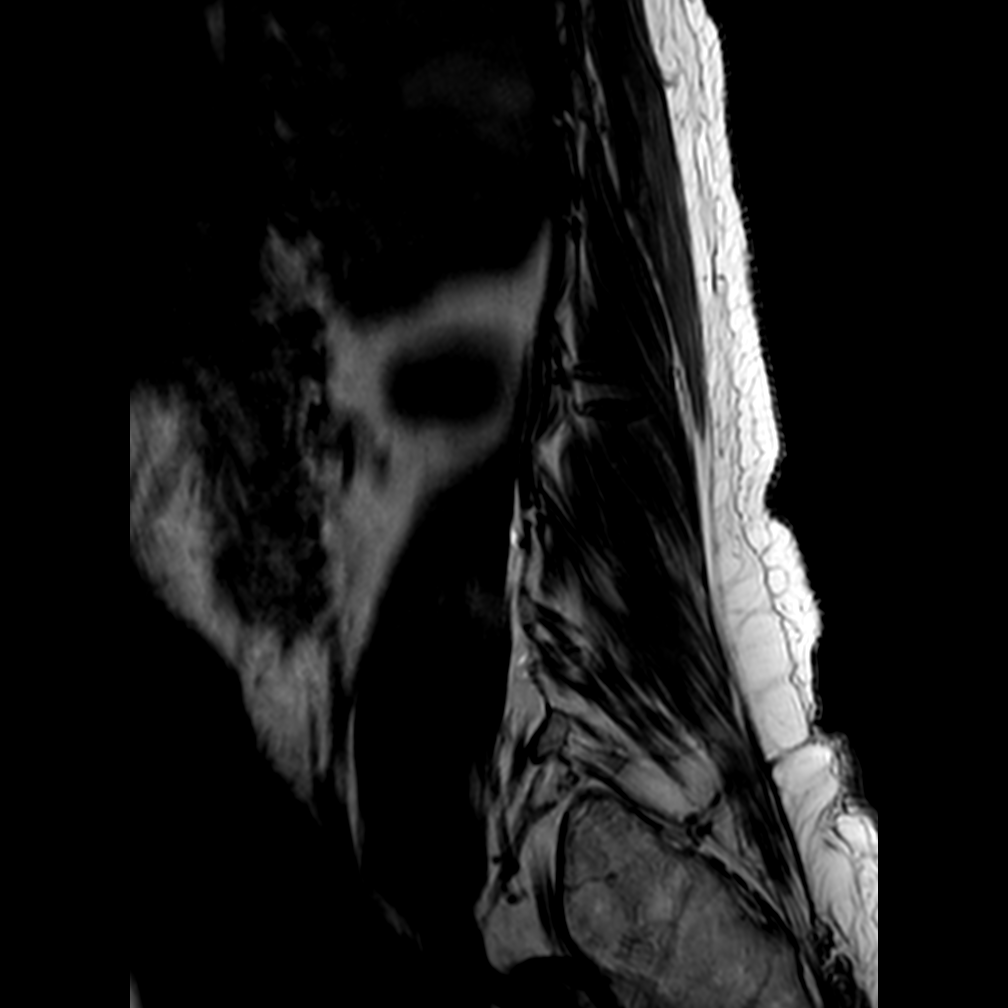

[Series 401: t2w_tse sag · sagittal · 4.0mm · 0.24mm/px · 2 of 17 slices shown]
[im 1/17]
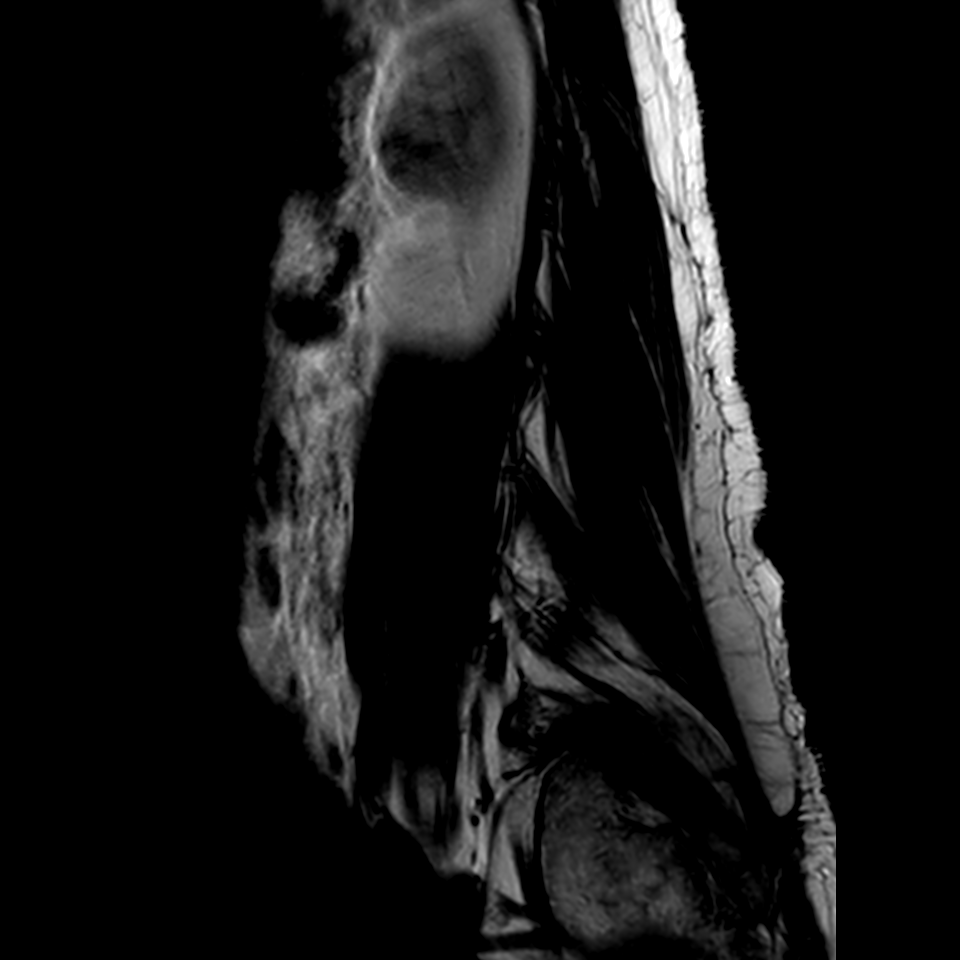
[im 17/17]
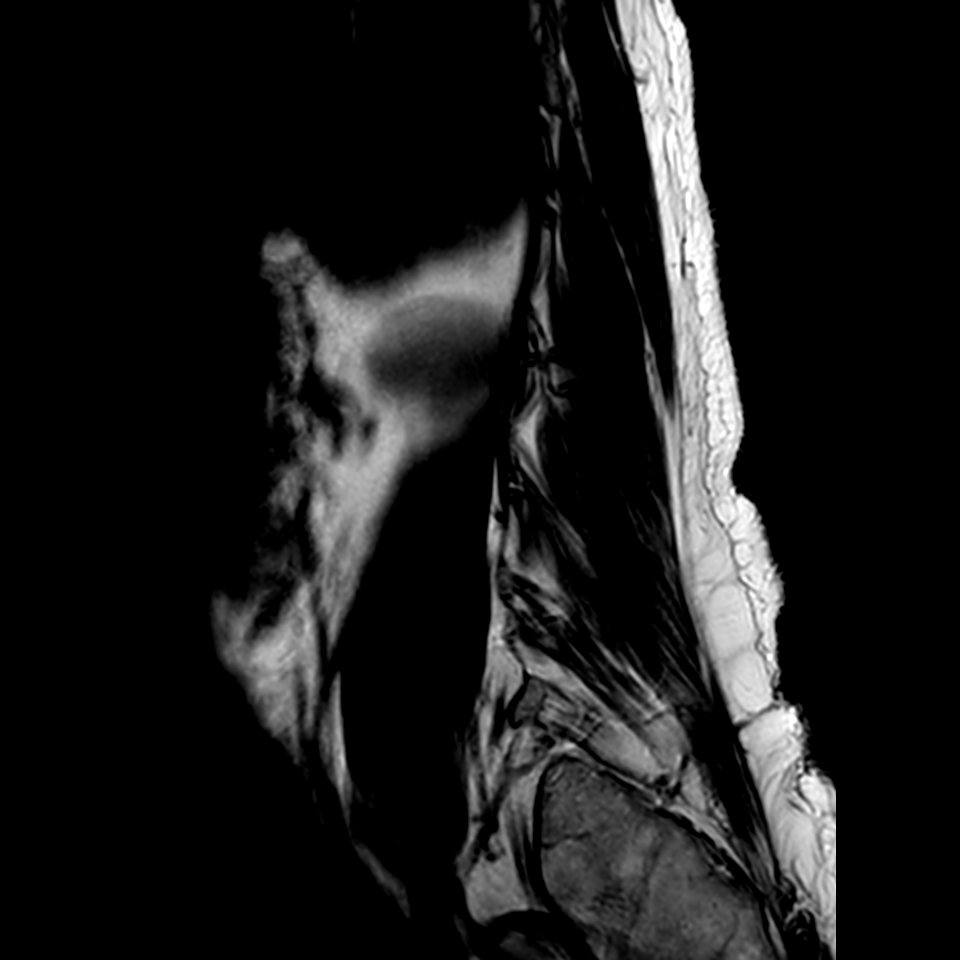

[Series 501: stir_sag-philips · sagittal · 4.0mm · 0.47mm/px · 3 of 17 slices shown]
[im 1/17]
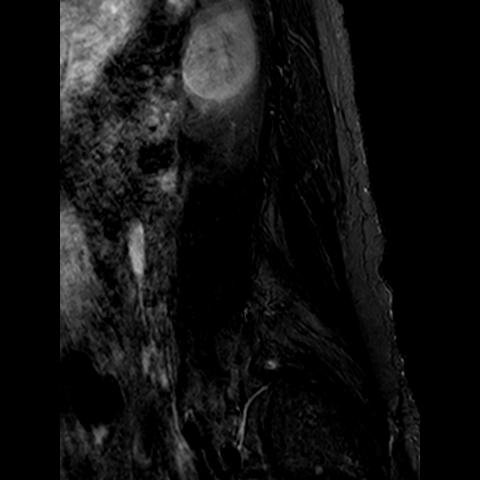
[im 9/17]
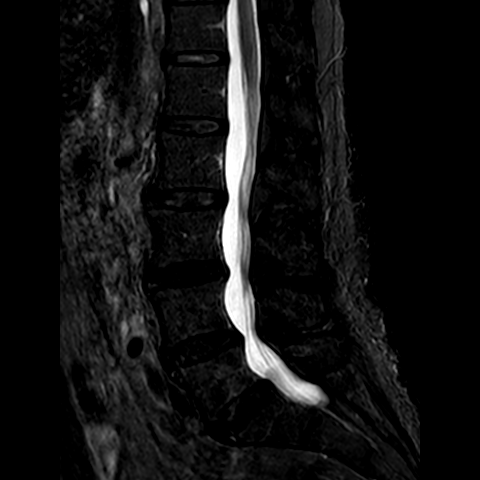
[im 17/17]
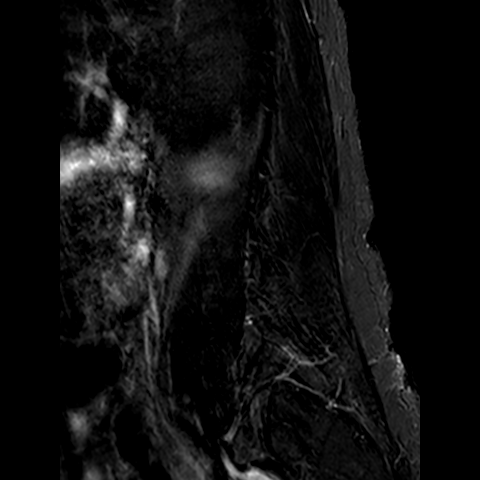

[9 of 48 positions shown; findings below may reference images not displayed]

FINDINGS: -------------------------------------------------------------------------------- 
------ 
GENERAL: 
Nomenclature is based on 5 lumbar type vertebral bodies.     
ALIGNMENT: Minimal levoconvex lumbar scoliosis. Grade 1 retrolisthesis L2 on L3 
and grade 1 anterolisthesis L5 on S1. Stable. 
VERTEBRAL BODY HEIGHT: Normal.  
MARROW SIGNAL: No focal suspect signal abnormality. 
CORD SIGNAL: Normal distal spinal cord and cauda equina. Conus medullaris 
terminates at L1-L2. 
ADDITIONAL FINDINGS: Subcentimeter left renal cyst. 
Modic I-II: None. 
Ligamentum Flavum > 2.5 mm: All levels. 
-------------------------------------------------------------------------------- 
------ 
SEGMENTAL: 
T12-L1: Normal disc height and signal. No herniation. Normal facets. No spinal 
canal or neural foraminal stenosis. 
L1-L2: Normal disc height and signal. No herniation. Normal facets. No spinal 
canal or neural foraminal stenosis. 
L2-L3: Minimal annular bulge. Borderline canal stenosis. Foramina patent. Normal 
facets. Stable. 
L3-L4: Loss of disc signal. Mild annular bulge with mild canal stenosis and mild 
narrowing of the lateral recesses bilaterally. Small left facet joint effusion. 
Right nerve root sleeve cyst. Mild bilateral foraminal narrowing. Anterior 
annular fissure. Stable. 
L4-L5: Loss of disc signal. Annular bulge with moderate canal stenosis and facet 
arthropathy. Moderate lateral recess narrowing bilaterally. Left nerve root 
sleeve cyst. Mild left foraminal narrowing. Mild right foraminal narrowing. 
Facet arthropathy. Stable. 
L5-S1: Loss of disc signal. Anterior annular fissure. Mild diffuse annular 
bulge. Left-sided synovial cyst narrows the left lateral recess. Cyst measures 4 
mm. Bilateral facet arthropathy. Canal otherwise patent. Moderate left and mild 
right foraminal narrowing. The cyst is somewhat smaller in the interval. 
-------------------------------------------------------------------------------- 
------
IMPRESSION: L5-S1, left-sided synovial cyst emanating from the left facet. This cyst is 
somewhat smaller in the interval, although there remains left lateral recess 
narrowing and could affect the traversing left S1 nerve root. 
Other stable multilevel lumbar degenerative changes.

## 2024-02-25 IMAGING — CT CT SCANOGRAM LEG LENGTH
1 series · 1 of 1 positions shown · non-contrast
Comparison: None

________________________________________________________________________________________________ 
CT SCANOGRAM LEG LENGTH, 02/25/2024 [DATE]: 
CLINICAL INDICATION: Unequal limb length.
TECHNIQUE: The pelvis to the ankles were scanned without contrast on a 
high-resolution CT scanner using dose reduction techniques. Routine MPR 
reconstructions were performed.  
Count of known CT and Cardiac Nuclear Medicine studies performed in the previous 
12 months = 0. 
VIEW: 1

[Series 1: topogram 0.6 t20f · coronal · 0.6mm · 2.00mm/px · 1 of 1 slices shown]
[im 1/1]
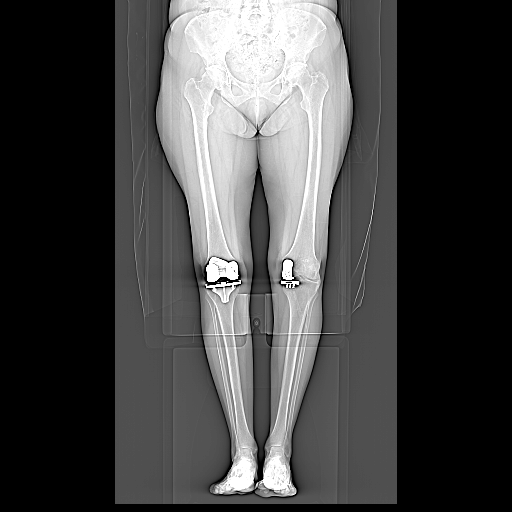

[1 of 1 positions shown; findings below may reference images not displayed]

FINDINGS: RIGHT LOWER EXTREMITY: 76.1 cm from the top of the femoral head to the tibial 
plafond  
RIGHT FEMUR:  42.5 cm from the top of the femoral head to the inferior medial 
femoral component of the total knee arthroplasty (TKA). 
RIGHT TIBIA: 33.0 cm from the top of the metallic plate of the TKA to the tibial 
plafond. 
LEFT LOWER EXTREMITY: 76.2 cm from the top of the femoral head to the tibial 
plafond  
LEFT FEMUR: 42.9 cm from the top of the femoral head to the inferior medial 
femoral component of the hemicompartment knee arthroplasty. 
LEFT TIBIA: 33.0 cm from the top of the metallic plate of the medial compartment 
hemiarthroplasty to the tibial plafond.
IMPRESSION: 1.  No leg length discrepancy. 
2.  Right TKA and left knee medial compartment hemiarthroplasty. 
RADIATION DOSE REDUCTION: All CT scans are performed using radiation dose 
reduction techniques, when applicable.  Technical factors are evaluated and 
adjusted to ensure appropriate moderation of exposure.  Automated dose 
management technology is applied to adjust the radiation doses to minimize 
exposure while achieving diagnostic quality images.
